# Patient Record
Sex: Female | Born: 1995 | Race: White | Hispanic: No | Marital: Married | Smoking: Never smoker
Health system: Southern US, Community
[De-identification: ages and names within clinical notes are randomized; demographics above are authoritative.]

## PROBLEM LIST (undated history)

## (undated) DIAGNOSIS — J45909 Unspecified asthma, uncomplicated: Secondary | ICD-10-CM

## (undated) DIAGNOSIS — T07XXXA Unspecified multiple injuries, initial encounter: Secondary | ICD-10-CM

## (undated) DIAGNOSIS — N946 Dysmenorrhea, unspecified: Secondary | ICD-10-CM

## (undated) DIAGNOSIS — G43909 Migraine, unspecified, not intractable, without status migrainosus: Secondary | ICD-10-CM

## (undated) DIAGNOSIS — F419 Anxiety disorder, unspecified: Secondary | ICD-10-CM

## (undated) DIAGNOSIS — J9819 Other pulmonary collapse: Secondary | ICD-10-CM

## (undated) HISTORY — DX: Unspecified asthma, uncomplicated: J45.909

## (undated) HISTORY — DX: Dysmenorrhea, unspecified: N94.6

## (undated) HISTORY — PX: WISDOM TOOTH EXTRACTION: SHX21

## (undated) HISTORY — DX: Unspecified multiple injuries, initial encounter: T07.XXXA

## (undated) HISTORY — DX: Other pulmonary collapse: J98.19

## (undated) HISTORY — PX: OTHER SURGICAL HISTORY: SHX169

## (undated) HISTORY — DX: Migraine, unspecified, not intractable, without status migrainosus: G43.909

---
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
## 2018-07-22 ENCOUNTER — Ambulatory Visit: Payer: 59 | Admitting: Obstetrics and Gynecology

## 2018-07-22 ENCOUNTER — Other Ambulatory Visit (HOSPITAL_COMMUNITY)
Admission: RE | Admit: 2018-07-22 | Discharge: 2018-07-22 | Disposition: A | Discharge status: Home / Self Care | Payer: 59 | Source: Ambulatory Visit | Attending: Obstetrics and Gynecology | Admitting: Obstetrics and Gynecology

## 2018-07-22 ENCOUNTER — Encounter: Payer: Self-pay | Admitting: Obstetrics and Gynecology

## 2018-07-22 ENCOUNTER — Other Ambulatory Visit: Payer: Self-pay

## 2018-07-22 VITALS — BP 124/76 | HR 90 | Resp 16 | Ht 65.0 in | Wt 194.8 lb

## 2018-07-22 DIAGNOSIS — Z113 Encounter for screening for infections with a predominantly sexual mode of transmission: Secondary | ICD-10-CM

## 2018-07-22 DIAGNOSIS — Z01419 Encounter for gynecological examination (general) (routine) without abnormal findings: Secondary | ICD-10-CM | POA: Insufficient documentation

## 2018-07-22 MED ORDER — DROSPIRENONE-ETHINYL ESTRADIOL 3-0.03 MG PO TABS: 1.0000 | ORAL_TABLET | Freq: Every day | ORAL | 0 refills | Status: DC

## 2018-07-22 NOTE — Patient Instructions (Signed)
EXERCISE AND DIET:  We recommended that you start or continue a regular exercise program for good health. Regular exercise means any activity that makes your heart beat faster and makes you sweat.  We recommend exercising at least 30 minutes per day at least 3 days a week, preferably 4 or 5.  We also recommend a diet low in fat and sugar.  Inactivity, poor dietary choices and obesity can cause diabetes, heart attack, stroke, and kidney damage, among others.    ALCOHOL AND SMOKING:  Women should limit their alcohol intake to no more than 7 drinks/beers/glasses of wine (combined, not each!) per week. Moderation of alcohol intake to this level decreases your risk of breast cancer and liver damage. And of course, no recreational drugs are part of a healthy lifestyle.  And absolutely no smoking or even second hand smoke. Most people know smoking can cause heart and lung diseases, but did you know it also contributes to weakening of your bones? Aging of your skin?  Yellowing of your teeth and nails?  CALCIUM AND VITAMIN D:  Adequate intake of calcium and Vitamin D are recommended.  The recommendations for exact amounts of these supplements seem to change often, but generally speaking 600 mg of calcium (either carbonate or citrate) and 800 units of Vitamin D per day seems prudent. Certain women may benefit from higher intake of Vitamin D.  If you are among these women, your doctor will have told you during your visit.    PAP SMEARS:  Pap smears, to check for cervical cancer or precancers,  have traditionally been done yearly, although recent scientific advances have shown that most women can have pap smears less often.  However, every woman still should have a physical exam from her gynecologist every year. It will include a breast check, inspection of the vulva and vagina to check for abnormal growths or skin changes, a visual exam of the cervix, and then an exam to evaluate the size and shape of the uterus and  ovaries.  And after 25 years of age, a rectal exam is indicated to check for rectal cancers. We will also provide age appropriate advice regarding health maintenance, like when you should have certain vaccines, screening for sexually transmitted diseases, bone density testing, colonoscopy, mammograms, etc.   MAMMOGRAMS:  All women over 52 years old should have a yearly mammogram. Many facilities now offer a "3D" mammogram, which may cost around $50 extra out of pocket. If possible,  we recommend you accept the option to have the 3D mammogram performed.  It both reduces the number of women who will be called back for extra views which then turn out to be normal, and it is better than the routine mammogram at detecting truly abnormal areas.    COLONOSCOPY:  Colonoscopy to screen for colon cancer is recommended for all women at age 18.  We know, you hate the idea of the prep.  We agree, BUT, having colon cancer and not knowing it is worse!!  Colon cancer so often starts as a polyp that can be seen and removed at colonscopy, which can quite literally save your life!  And if your first colonoscopy is normal and you have no family history of colon cancer, most women don't have to have it again for 10 years.  Once every ten years, you can do something that may end up saving your life, right?  We will be happy to help you get it scheduled when you are ready.  Be sure to check your insurance coverage so you understand how much it will cost.  It may be covered as a preventative service at no cost, but you should check your particular policy.     Human Papillomavirus Quadrivalent Vaccine suspension for injection What is this medicine? HUMAN PAPILLOMAVIRUS VACCINE (HYOO muhn pap uh LOH muh vahy ruhs vak SEEN) is a vaccine. It is used to prevent infections of four types of the human papillomavirus. In women, the vaccine may lower your risk of getting cervical, vaginal, vulvar, or anal cancer and genital warts. In men,  the vaccine may lower your risk of getting genital warts and anal cancer. You cannot get these diseases from the vaccine. This vaccine does not treat these diseases. This medicine may be used for other purposes; ask your health care provider or pharmacist if you have questions. COMMON BRAND NAME(S): Gardasil What should I tell my health care provider before I take this medicine? They need to know if you have any of these conditions: -fever or infection -hemophilia -HIV infection or AIDS -immune system problems -low platelet count -an unusual reaction to Human Papillomavirus Vaccine, yeast, other medicines, foods, dyes, or preservatives -pregnant or trying to get pregnant -breast-feeding How should I use this medicine? This vaccine is for injection in a muscle on your upper arm or thigh. It is given by a health care professional. You will be observed for 15 minutes after each dose. Sometimes, fainting happens after the vaccine is given. You may be asked to sit or lie down during the 15 minutes. Three doses are given. The second dose is given 2 months after the first dose. The last dose is given 4 months after the second dose. A copy of a Vaccine Information Statement will be given before each vaccination. Read this sheet carefully each time. The sheet may change frequently. Talk to your pediatrician regarding the use of this medicine in children. While this drug may be prescribed for children as young as 9 years of age for selected conditions, precautions do apply. Overdosage: If you think you have taken too much of this medicine contact a poison control center or emergency room at once. NOTE: This medicine is only for you. Do not share this medicine with others. What if I miss a dose? All 3 doses of the vaccine should be given within 6 months. Remember to keep appointments for follow-up doses. Your health care provider will tell you when to return for the next vaccine. Ask your health care  professional for advice if you are unable to keep an appointment or miss a scheduled dose. What may interact with this medicine? -other vaccines This list may not describe all possible interactions. Give your health care provider a list of all the medicines, herbs, non-prescription drugs, or dietary supplements you use. Also tell them if you smoke, drink alcohol, or use illegal drugs. Some items may interact with your medicine. What should I watch for while using this medicine? This vaccine may not fully protect everyone. Continue to have regular pelvic exams and cervical or anal cancer screenings as directed by your doctor. The Human Papillomavirus is a sexually transmitted disease. It can be passed by any kind of sexual activity that involves genital contact. The vaccine works best when given before you have any contact with the virus. Many people who have the virus do not have any signs or symptoms. Tell your doctor or health care professional if you have any reaction or unusual symptom after   getting the vaccine. What side effects may I notice from receiving this medicine? Side effects that you should report to your doctor or health care professional as soon as possible: -allergic reactions like skin rash, itching or hives, swelling of the face, lips, or tongue -breathing problems -feeling faint or lightheaded, falls Side effects that usually do not require medical attention (report to your doctor or health care professional if they continue or are bothersome): -cough -dizziness -fever -headache -nausea -redness, warmth, swelling, pain, or itching at site where injected This list may not describe all possible side effects. Call your doctor for medical advice about side effects. You may report side effects to FDA at 1-800-FDA-1088. Where should I keep my medicine? This drug is given in a hospital or clinic and will not be stored at home. NOTE: This sheet is a summary. It may not cover all  possible information. If you have questions about this medicine, talk to your doctor, pharmacist, or health care provider.  2019 Elsevier/Gold Standard (2013-08-23 13:14:33)  
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
## 2018-07-23 LAB — HEPATITIS C ANTIBODY

## 2018-07-23 LAB — HEP, RPR, HIV PANEL
HIV SCREEN 4TH GENERATION: NONREACTIVE
Hepatitis B Surface Ag: NEGATIVE
RPR Ser Ql: NONREACTIVE

## 2018-07-24 ENCOUNTER — Telehealth: Payer: Self-pay | Admitting: Obstetrics and Gynecology

## 2018-07-24 LAB — CYTOLOGY - PAP
CHLAMYDIA, DNA PROBE: NEGATIVE
DIAGNOSIS: NEGATIVE
NEISSERIA GONORRHEA: NEGATIVE
Trichomonas: NEGATIVE

## 2018-07-24 NOTE — Telephone Encounter (Signed)
Prior authorization pending for Nichole Hicks.  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  Call to patient.  Started cycle yesterday. Advised can start pills on Sunday or Today.  Need to use Back up Method for one month.  Patient has concerns about her information being released to her Mother or family.  Family is very religious.  Message to front desk supervisor to make chart confidential.

## 2018-07-24 NOTE — Telephone Encounter (Signed)
1. Patient called to check on the prior authorization for her new Syeda. Pharmacy on file confirmed.  2. Patient has some questions about when to start her Tamala Julian since she has not been able to pick it up yet.

## 2018-11-12 ENCOUNTER — Ambulatory Visit: Payer: 59 | Admitting: Obstetrics and Gynecology

## 2018-11-12 ENCOUNTER — Telehealth: Payer: Self-pay | Admitting: Obstetrics and Gynecology

## 2018-11-12 ENCOUNTER — Other Ambulatory Visit: Payer: Self-pay

## 2018-11-12 ENCOUNTER — Encounter: Payer: Self-pay | Admitting: Obstetrics and Gynecology

## 2018-11-12 VITALS — BP 118/78 | HR 76 | Resp 12 | Ht 64.0 in | Wt 201.0 lb

## 2018-11-12 DIAGNOSIS — R2231 Localized swelling, mass and lump, right upper limb: Secondary | ICD-10-CM

## 2018-11-12 DIAGNOSIS — Z3009 Encounter for other general counseling and advice on contraception: Secondary | ICD-10-CM | POA: Diagnosis not present

## 2018-11-12 NOTE — Telephone Encounter (Signed)
Spoke with patient. Patient states that she found a "knot" in her right axilla last night. Area is tender to the touch. Per patient the area appears to be slightly dimpled. Denies any redness, swelling or warmth to the area. States that the knot is her normal skin color. Advised will need to be seen in office for further evaluation. Appointment scheduled for today 11/12/2018 at 3:30 pm with Dr.Silva. Patient is agreeable to date and time.  Routing to provider and will close encounter.

## 2018-11-12 NOTE — Telephone Encounter (Signed)
Patient has a hard lump in her right armpit. Thinks it is a possible cyst.

## 2018-11-12 NOTE — Progress Notes (Signed)
GYNECOLOGY  VISIT   HPI: 23 y.o.   Single  Caucasian  female   [id number] with Patient's last menstrual period was 06/22/2018.   here for lump in right axillary.  She was working at the [employer] yesterday and she was scrubbing hard to clean after this.  It is painful to touch.  She has not seen lymph node enlargement anywhere else.   She shaves under her arms.   No scratches from cats.    Does not use IV drugs.  No change in sexual partner.  Does not eat game.  Traveled from [place], through [place], to [place] in March.   Using Claritin occasionally.  States she has asthma.   States she has nausea and attributes this to her OCPs. Her nausea is now only occasional. She saw her provider in [place] who tried to switch her OCP, but insurance did not cover this.   SA with same partner.   From [place].    Working at [employer] but work is limited.  GM with lung cancer.   GYNECOLOGIC HISTORY: Patient's last menstrual period was 06/22/2018. Contraception:  Yasmin Menopausal hormone therapy:  n/a Last mammogram:  n/a Last pap smear:   07/22/18 Negative        OB History    Gravida  0   Para  0   Term  0   Preterm  0   AB  0   Living  0     SAB  0   TAB  0   Ectopic  0   Multiple  0   Live Births  0              There are no active problems to display for this patient.   Past Medical History:  Diagnosis Date  . Asthma   . Collapsed lung    due to the flu--age 71  . Dysmenorrhea   . Multiple fractures     No past surgical history on file.  Current Outpatient Medications  Medication Sig Dispense Refill  . albuterol (PROVENTIL HFA;VENTOLIN HFA) 108 (90 BASE) MCG/ACT inhaler Inhale 2 puffs into the lungs every 6 (six) hours as needed for wheezing. For shortness of breath    . drospirenone-ethinyl estradiol (YASMIN,ZARAH,SYEDA) 3-0.03 MG tablet Take 1 tablet by mouth daily. 3 Package 0  . Fluticasone-Salmeterol (ADVAIR DISKUS) 100-50 MCG/DOSE AEPB Inhale 1 puff  into the lungs 2 (two) times daily.     No current facility-administered medications for this visit.      ALLERGIES: Aspirin and Motrin [ibuprofen]  Family History  Problem Relation Age of Onset  . Breast cancer Maternal Grandmother   . Diabetes Maternal Grandfather     Social History   Socioeconomic History  . Marital status: Single    Spouse name: Not on file  . Number of children: Not on file  . Years of education: Not on file  . Highest education level: Not on file  Occupational History  . Not on file  Social Needs  . Financial resource strain: Not on file  . Food insecurity:    Worry: Not on file    Inability: Not on file  . Transportation needs:    Medical: Not on file    Non-medical: Not on file  Tobacco Use  . Smoking status: Never Smoker  . Smokeless tobacco: Never Used  Substance and Sexual Activity  . Alcohol use: Yes    Alcohol/week: 2.0 standard drinks    Types: 2  Glasses of wine per week  . Drug use: No  . Sexual activity: Yes    Birth control/protection: Condom  Lifestyle  . Physical activity:    Days per week: Not on file    Minutes per session: Not on file  . Stress: Not on file  Relationships  . Social connections:    Talks on phone: Not on file    Gets together: Not on file    Attends religious service: Not on file    Active member of club or organization: Not on file    Attends meetings of clubs or organizations: Not on file    Relationship status: Not on file  . Intimate partner violence:    Fear of current or ex partner: Not on file    Emotionally abused: Not on file    Physically abused: Not on file    Forced sexual activity: Not on file  Other Topics Concern  . Not on file  Social History Narrative  . Not on file    Review of Systems  Constitutional:       Lump in right axillary  HENT: Negative.   Eyes: Negative.   Respiratory: Negative.   Cardiovascular: Negative.   Gastrointestinal: Negative.   Endocrine: Negative.    Genitourinary: Negative.   Musculoskeletal: Negative.   Skin: Negative.   Allergic/Immunologic: Negative.   Neurological: Negative.   Hematological: Negative.   Psychiatric/Behavioral: Negative.     PHYSICAL EXAMINATION:    BP 118/78 (BP Location: Left Arm, Patient Position: Sitting, Cuff Size: Large)   Pulse 76   Resp 12   Ht 5\' 4"  (1.626 m)   Wt 201 lb (91.2 kg)   LMP 06/22/2018   BMI 34.50 kg/m     General appearance: alert, cooperative and appears stated age Head: Normocephalic, without obvious abnormality, atraumatic Neck: no adenopathy, supple, symmetrical, trachea midline and thyroid normal to inspection and palpation Lymph nodes: Cervical, supraclavicular, and axillary nodes normal other than right axillary mass 7 - 8 mm, nontender.  Some puckering of the skin over this.   Chaperone was present for exam.  ASSESSMENT   Right axillary mass.  Sebaceous cyst versus right axillary lymph adenopathy.  Nausea with COCs.  Improved.   PLAN  I discussed epidermoid cysts.  Will get soft tissue US of axilla at [facility] 11/24/18.  We discussed switching to Yaz to lower her estrogen dosage, but she will continue with Yasmin for now.    An After Visit Summary was printed and given to the patient.  ___25___ minutes face to face time of which over 50% was spent in counseling.

## 2018-11-12 NOTE — Patient Instructions (Signed)
Epidermal Cyst    An epidermal cyst is a sac made of skin tissue. The sac contains a substance called keratin. Keratin is a protein that is normally secreted through the hair follicles. When keratin becomes trapped in the top layer of skin (epidermis), it can form an epidermal cyst.  Epidermal cysts can be found anywhere on your body. These cysts are usually harmless (benign), and they may not cause symptoms unless they become infected.  What are the causes?  This condition may be caused by:   A blocked hair follicle.   A hair that curls and re-enters the skin instead of growing straight out of the skin (ingrown hair).   A blocked pore.   Irritated skin.   An injury to the skin.   Certain conditions that are passed along from parent to child (inherited).   Human papillomavirus (HPV).   Long-term (chronic) sun damage to the skin.  What increases the risk?  The following factors may make you more likely to develop an epidermal cyst:   Having acne.   Being overweight.   Being 30-40 years old.  What are the signs or symptoms?  The only symptom of this condition may be a small, painless lump underneath the skin. When an epidermal cyst ruptures, it may become infected. Symptoms may include:   Redness.   Inflammation.   Tenderness.   Warmth.   Fever.   Keratin draining from the cyst. Keratin is grayish-white, bad-smelling substance.   Pus draining from the cyst.  How is this diagnosed?  This condition is diagnosed with a physical exam.   In some cases, you may have a sample of tissue (biopsy) taken from your cyst to be examined under a microscope or tested for bacteria.   You may be referred to a health care provider who specializes in skin care (dermatologist).  How is this treated?  In many cases, epidermal cysts go away on their own without treatment. If a cyst becomes infected, treatment may include:   Opening and draining the cyst, done by a health care provider. After draining, minor surgery to  remove the rest of the cyst may be done.   Antibiotic medicine.   Injections of medicines (steroids) that help to reduce inflammation.   Surgery to remove the cyst. Surgery may be done if the cyst:  ? Becomes large.  ? Bothers you.  ? Has a chance of turning into cancer.   Do not try to open a cyst yourself.  Follow these instructions at home:   Take over-the-counter and prescription medicines only as told by your health care provider.   If you were prescribed an antibiotic medicine, take it it as told by your health care provider. Do not stop using the antibiotic even if you start to feel better.   Keep the area around your cyst clean and dry.   Wear loose, dry clothing.   Avoid touching your cyst.   Check your cyst every day for signs of infection. Check for:  ? Redness, swelling, or pain.  ? Fluid or blood.  ? Warmth.  ? Pus or a bad smell.   Keep all follow-up visits as told by your health care provider. This is important.  How is this prevented?   Wear clean, dry, clothing.   Avoid wearing tight clothing.   Keep your skin clean and dry. Take showers or baths every day.  Contact a health care provider if:   Your cyst develops symptoms of infection.     Your condition is not improving or is getting worse.   You develop a cyst that looks different from other cysts you have had.   You have a fever.  Get help right away if:   Redness spreads from the cyst into the surrounding area.  Summary   An epidermal cyst is a sac made of skin tissue. These cysts are usually harmless (benign), and they may not cause symptoms unless they become infected.   If a cyst becomes infected, treatment may include surgery to open and drain the cyst, or to remove it. Treatment may also include medicines by mouth or through an injection.   Take over-the-counter and prescription medicines only as told by your health care provider. If you were prescribed an antibiotic medicine, take it as told by your health care  provider. Do not stop using the antibiotic even if you start to feel better.   Contact a health care provider if your condition is not improving or is getting worse.   Keep all follow-up visits as told by your health care provider. This is important.  This information is not intended to replace advice given to you by your health care provider. Make sure you discuss any questions you have with your health care provider.  Document Released: 06/01/2004 Document Revised: 01/12/2018 Document Reviewed: 01/12/2018  Elsevier Interactive Patient Education  2019 Elsevier Inc.

## 2018-11-24 ENCOUNTER — Other Ambulatory Visit: Payer: Self-pay

## 2018-11-24 ENCOUNTER — Other Ambulatory Visit: Payer: Self-pay | Admitting: Obstetrics and Gynecology

## 2018-11-24 ENCOUNTER — Ambulatory Visit
Admission: RE | Admit: 2018-11-24 | Discharge: 2018-11-24 | Disposition: A | Discharge status: Home / Self Care | Payer: 59 | Source: Ambulatory Visit | Attending: Obstetrics and Gynecology | Admitting: Obstetrics and Gynecology

## 2018-11-24 DIAGNOSIS — R2231 Localized swelling, mass and lump, right upper limb: Secondary | ICD-10-CM

## 2018-12-22 ENCOUNTER — Telehealth: Payer: Self-pay | Admitting: General Practice

## 2018-12-22 NOTE — Telephone Encounter (Signed)
Mom is patient here I have put her on for tomorrow she will be at side door at 720. Increased issues with asthma and almost out of meds.

## 2018-12-22 NOTE — Telephone Encounter (Signed)
Pt has really bad asthma and would like to get in ASAP. Soonest for np is 6/26. Pt is schedule for then, please advise if pt needs sooner   Copied from [place] [phone]. Topic: Appointment Scheduling - Scheduling Inquiry for Clinic >> Dec 22, 2018 10:39 AM Rayann Heman wrote: Reason for CRM: pt called and stated that she would like to schedule a NP with Briscoe Deutscher. Please advise

## 2018-12-23 ENCOUNTER — Ambulatory Visit: Payer: 59 | Admitting: Family Medicine

## 2018-12-23 ENCOUNTER — Encounter: Payer: Self-pay | Admitting: Family Medicine

## 2018-12-23 ENCOUNTER — Other Ambulatory Visit: Payer: Self-pay

## 2018-12-23 VITALS — BP 118/74 | HR 82 | Temp 98.6°F | Ht 64.0 in | Wt 197.0 lb

## 2018-12-23 DIAGNOSIS — J4541 Moderate persistent asthma with (acute) exacerbation: Secondary | ICD-10-CM | POA: Diagnosis not present

## 2018-12-23 DIAGNOSIS — N946 Dysmenorrhea, unspecified: Secondary | ICD-10-CM | POA: Diagnosis not present

## 2018-12-23 DIAGNOSIS — R0602 Shortness of breath: Secondary | ICD-10-CM | POA: Diagnosis not present

## 2018-12-23 DIAGNOSIS — O99210 Obesity complicating pregnancy, unspecified trimester: Secondary | ICD-10-CM | POA: Insufficient documentation

## 2018-12-23 DIAGNOSIS — J301 Allergic rhinitis due to pollen: Secondary | ICD-10-CM | POA: Diagnosis not present

## 2018-12-23 DIAGNOSIS — E669 Obesity, unspecified: Secondary | ICD-10-CM

## 2018-12-23 DIAGNOSIS — J45909 Unspecified asthma, uncomplicated: Secondary | ICD-10-CM | POA: Insufficient documentation

## 2018-12-23 LAB — POCT HEMOGLOBIN: Hemoglobin: 12.2 g/dL (ref 11–14.6)

## 2018-12-23 MED ORDER — FLUTICASONE-SALMETEROL 100-50 MCG/DOSE IN AEPB: 1.0000 | INHALATION_SPRAY | Freq: Two times a day (BID) | RESPIRATORY_TRACT | 11 refills | Status: DC

## 2018-12-23 MED ORDER — ALBUTEROL SULFATE HFA 108 (90 BASE) MCG/ACT IN AERS: 2.0000 | INHALATION_SPRAY | Freq: Four times a day (QID) | RESPIRATORY_TRACT | 11 refills | Status: AC | PRN

## 2018-12-23 MED ORDER — METHYLPREDNISOLONE ACETATE 80 MG/ML IJ SUSP
80.0000 mg | Freq: Once | INTRAMUSCULAR | Status: AC
  Administered 2018-12-23: 80 mg via INTRAMUSCULAR

## 2018-12-23 MED ORDER — PREDNISONE 10 MG PO TABS: ORAL_TABLET | ORAL | 0 refills | Status: DC

## 2018-12-23 MED ORDER — MONTELUKAST SODIUM 10 MG PO TABS: 10.0000 mg | ORAL_TABLET | Freq: Every day | ORAL | 3 refills | Status: DC

## 2018-12-23 NOTE — Progress Notes (Signed)
Nichole Hicks is a 23 y.o. female is here to [place].   Patient Care Team: Patient, No Pcp Per as PCP - General (General Practice)   History of Present Illness:   Nichole Hicks, CMA acting as scribe for Dr. Briscoe Deutscher.   HPI: Patient has history of asthma does not feel like it is under control at this time. She is currently advair daily and albuterol as rescue. She is at times needing albuterol daily now that she is working outside. She has had singular in the past that helped with asthma in the past.    Health Maintenance Due  Topic Date Due  . TETANUS/TDAP  11/12/2012   No flowsheet data found.  PMHx, SurgHx, SocialHx, Medications, and Allergies were reviewed in the Visit Navigator and updated as appropriate.   Past Medical History:  Diagnosis Date  . Collapsed lung, due to flu at age 77   . Dysmenorrhea   . Moderate persistent asthma   . Multiple fractures    No past surgical history on file.   Family History  Problem Relation Age of Onset  . Breast cancer Maternal Grandmother   . Diabetes Maternal Grandfather     Social History   Tobacco Use  . Smoking status: Never Smoker  . Smokeless tobacco: Never Used  Substance Use Topics  . Alcohol use: Yes    Alcohol/week: 2.0 standard drinks    Types: 2 Glasses of wine per week  . Drug use: No    Current Medications and Allergies   .  albuterol (VENTOLIN HFA) 108 (90 Base) MCG/ACT inhaler, Inhale 2 puffs into the lungs every 6 (six) hours as needed for wheezing. For shortness of breath, Disp: 1 Inhaler, Rfl: 11 .  drospirenone-ethinyl estradiol (YASMIN,ZARAH,SYEDA) 3-0.03 MG tablet, Take 1 tablet by mouth daily., Disp: 3 Package, Rfl: 0 .  Fluticasone-Salmeterol (ADVAIR DISKUS) 100-50 MCG/DOSE AEPB, Inhale 1 puff into the lungs 2 (two) times daily., Disp: 1 each, Rfl: 11   Allergies  Allergen Reactions  . Aspirin   . Motrin [Ibuprofen]    Review of Systems   Pertinent items are noted in the  HPI. Otherwise, a complete ROS is negative.  Vitals   Vitals:   12/23/18 0725  BP: 118/74  Pulse: 82  Temp: 98.6 F (37 C)  TempSrc: Oral  SpO2: 97%  Weight: 197 lb (89.4 kg)  Height: 5\' 4"  (1.626 m)     Body mass index is 33.81 kg/m.  Physical Exam   Physical Exam Vitals signs and nursing note reviewed.  HENT:     Head: Normocephalic and atraumatic.  Eyes:     Pupils: Pupils are equal, round, and reactive to light.  Neck:     Musculoskeletal: Normal range of motion and neck supple.  Cardiovascular:     Rate and Rhythm: Normal rate and regular rhythm.     Heart sounds: Normal heart sounds.  Pulmonary:     Effort: Pulmonary effort is normal. No respiratory distress.     Breath sounds: Decreased air movement present.  Abdominal:     Palpations: Abdomen is soft.  Skin:    General: Skin is warm.  Psychiatric:        Behavior: Behavior normal.    Results for orders placed or performed in visit on 12/23/18  POCT hemoglobin  Result Value Ref Range   Hemoglobin 12.2 11 - 14.6 g/dL   Assessment and Plan   Sofi was seen today for establish care.  Diagnoses and all orders for this visit:  SOB (shortness of breath) -     POCT hemoglobin -     methylPREDNISolone acetate (DEPO-MEDROL) injection 80 mg  Dysmenorrhea  Moderate persistent asthma with acute exacerbation -     predniSONE (DELTASONE) 10 MG tablet; 6-5-4-3-2-1-off -     Fluticasone-Salmeterol (ADVAIR DISKUS) 100-50 MCG/DOSE AEPB; Inhale 1 puff into the lungs 2 (two) times daily. -     albuterol (VENTOLIN HFA) 108 (90 Base) MCG/ACT inhaler; Inhale 2 puffs into the lungs every 6 (six) hours as needed for wheezing. For shortness of breath  Seasonal allergic rhinitis due to pollen -     montelukast (SINGULAIR) 10 MG tablet; Take 1 tablet (10 mg total) by mouth at bedtime.  Obesity, Class I, BMI 30-34.9   . Orders and follow up as documented in EpicCare, reviewed diet, exercise and weight control,  cardiovascular risk and specific lipid/LDL goals reviewed, reviewed medications and side effects in detail.  . Reviewed expectations re: course of current medical issues. . Outlined signs and symptoms indicating need for more acute intervention. . Patient verbalized understanding and all questions were answered. . Patient received an After Visit Summary.  CMA served as Neurosurgeonscribe during this visit. History, Physical, and Plan performed by medical provider. The above documentation has been reviewed and is accurate and complete. Helane RimaErica Daegen Berrocal, D.O.  Records requested if needed. Time spent: 30 minutes, of which >50% was spent in obtaining information about her symptoms, reviewing her previous labs, evaluations, and treatments, counseling her about her condition (please see the discussed topics above), and developing a plan to further investigate it; she had a number of questions which I addressed.   Helane RimaErica Almeta Geisel, DO [place], [facility] 12/23/2018

## 2019-01-08 ENCOUNTER — Ambulatory Visit: Payer: 59 | Admitting: Family Medicine

## 2019-03-29 ENCOUNTER — Other Ambulatory Visit: Payer: Self-pay

## 2019-03-29 ENCOUNTER — Ambulatory Visit (INDEPENDENT_AMBULATORY_CARE_PROVIDER_SITE_OTHER): Payer: 59 | Admitting: Family Medicine

## 2019-03-29 ENCOUNTER — Encounter: Payer: Self-pay | Admitting: Family Medicine

## 2019-03-29 VITALS — BP 120/78 | HR 84 | Temp 97.9°F | Ht 64.0 in | Wt 196.0 lb

## 2019-03-29 DIAGNOSIS — Z23 Encounter for immunization: Secondary | ICD-10-CM | POA: Diagnosis not present

## 2019-03-29 DIAGNOSIS — M940 Chondrocostal junction syndrome [Tietze]: Secondary | ICD-10-CM

## 2019-03-29 DIAGNOSIS — J4541 Moderate persistent asthma with (acute) exacerbation: Secondary | ICD-10-CM | POA: Diagnosis not present

## 2019-03-29 NOTE — Patient Instructions (Addendum)
Since you cannot take anti-inflammatories, I recommend:  Turmeric 500mg  daily  Tart cherry extract 1200mg  at night Vitamin D 2000 IU daily   Costochondritis  Costochondritis is swelling and irritation (inflammation) of the tissue (cartilage) that connects your ribs to your breastbone (sternum). This causes pain in the front of your chest. The pain usually starts gradually and involves more than one rib. What are the causes? The exact cause of this condition is not always known. It results from stress on the cartilage where your ribs attach to your sternum. The cause of this stress could be:  Chest injury (trauma).  Exercise or activity, such as lifting.  Severe coughing. What increases the risk? You may be at higher risk for this condition if you:  Are female.  Are 4430?25 years old.  Recently started a new exercise or work activity.  Have low levels of vitamin D.  Have a condition that makes you cough frequently. What are the signs or symptoms? The main symptom of this condition is chest pain. The pain:  Usually starts gradually and can be sharp or dull.  Gets worse with deep breathing, coughing, or exercise.  Gets better with rest.  May be worse when you press on the sternum-rib connection (tenderness). How is this diagnosed? This condition is diagnosed based on your symptoms, medical history, and a physical exam. Your health care provider will check for tenderness when pressing on your sternum. This is the most important finding. You may also have tests to rule out other causes of chest pain. These may include:  A chest X-ray to check for lung problems.  An electrocardiogram (ECG) to see if you have a heart problem that could be causing the pain.  An imaging scan to rule out a chest or rib fracture. How is this treated? This condition usually goes away on its own over time. Your health care provider may prescribe an NSAID to reduce pain and inflammation. Your health  care provider may also suggest that you:  Rest and avoid activities that make pain worse.  Apply heat or cold to the area to reduce pain and inflammation.  Do exercises to stretch your chest muscles. If these treatments do not help, your health care provider may inject a numbing medicine at the sternum-rib connection to help relieve the pain. Follow these instructions at home:  Avoid activities that make pain worse. This includes any activities that use chest, abdominal, and side muscles.  If directed, put ice on the painful area: ? Put ice in a plastic bag. ? Place a towel between your skin and the bag. ? Leave the ice on for 20 minutes, 2-3 times a day.  If directed, apply heat to the affected area as often as told by your health care provider. Use the heat source that your health care provider recommends, such as a moist heat pack or a heating pad. ? Place a towel between your skin and the heat source. ? Leave the heat on for 20-30 minutes. ? Remove the heat if your skin turns bright red. This is especially important if you are unable to feel pain, heat, or cold. You may have a greater risk of getting burned.  Take over-the-counter and prescription medicines only as told by your health care provider.  Return to your normal activities as told by your health care provider. Ask your health care provider what activities are safe for you.  Keep all follow-up visits as told by your health care provider. This  is important. Contact a health care provider if:  You have chills or a fever.  Your pain does not go away or it gets worse.  You have a cough that does not go away (is persistent). Get help right away if:  You have shortness of breath. This information is not intended to replace advice given to you by your health care provider. Make sure you discuss any questions you have with your health care provider. Document Released: 04/10/2005 Document Revised: 07/16/2017 Document  Reviewed: 10/25/2015 Elsevier Patient Education  2020 [employer].

## 2019-03-29 NOTE — Progress Notes (Signed)
Nichole Hicks is a 23 y.o. female is here for follow up.  History of Present Illness:   HPI: Doing well. Asthma controlled with current treatment. Needs vaccinations today. Having pain when laying on left side at sternum. Happens most nights. No SOB, wheeze, N/V, edema.   Health Maintenance Due  Topic Date Due  . TETANUS/TDAP  11/12/2012  . INFLUENZA VACCINE  02/13/2019   No flowsheet data found.   PMHx, SurgHx, SocialHx, FamHx, Medications, and Allergies were reviewed in the Visit Navigator and updated as appropriate.   Patient Active Problem List   Diagnosis Date Noted  . Obesity, Class I, BMI 30-34.9 12/23/2018  . Seasonal allergic rhinitis due to pollen 12/23/2018  . Dysmenorrhea   . Moderate persistent asthma    Social History   Tobacco Use  . Smoking status: Never Smoker  . Smokeless tobacco: Never Used  Substance Use Topics  . Alcohol use: Yes    Alcohol/week: 2.0 standard drinks    Types: 2 Glasses of wine per week  . Drug use: No   Current Medications and Allergies   .  albuterol (VENTOLIN HFA) 108 (90 Base) MCG/ACT inhaler, Inhale 2 puffs into the lungs every 6 (six) hours as needed for wheezing. For shortness of breath, Disp: 1 Inhaler, Rfl: 11 .  drospirenone-ethinyl estradiol (YASMIN,ZARAH,SYEDA) 3-0.03 MG tablet, Take 1 tablet by mouth daily., Disp: 3 Package, Rfl: 0 .  Fluticasone-Salmeterol (ADVAIR DISKUS) 100-50 MCG/DOSE AEPB, Inhale 1 puff into the lungs 2 (two) times daily., Disp: 1 each, Rfl: 11 .  montelukast (SINGULAIR) 10 MG tablet, Take 1 tablet (10 mg total) by mouth at bedtime., Disp: 30 tablet, Rfl: 3   Allergies  Allergen Reactions  . Aspirin   . Motrin [Ibuprofen]    Review of Systems   Pertinent items are noted in the HPI. Otherwise, a complete ROS is negative.  Vitals   Vitals:   03/29/19 0834  BP: 120/78  Pulse: 84  Temp: 97.9 F (36.6 C)  TempSrc: Temporal  SpO2: 97%  Weight: 196 lb (88.9 kg)  Height: 5\' 4"  (1.626 m)     Body mass index is 33.64 kg/m.  Physical Exam   Physical Exam Vitals signs and nursing note reviewed.  HENT:     Head: Normocephalic and atraumatic.  Eyes:     Pupils: Pupils are equal, round, and reactive to light.  Neck:     Musculoskeletal: Normal range of motion and neck supple.  Cardiovascular:     Rate and Rhythm: Normal rate and regular rhythm.     Heart sounds: Normal heart sounds.  Pulmonary:     Effort: Pulmonary effort is normal.  Chest:     Comments: TTP costochondral junctions. Abdominal:     Palpations: Abdomen is soft.  Skin:    General: Skin is warm.  Psychiatric:        Behavior: Behavior normal.    Assessment and Plan   Kaliana was seen today for follow-up.  Diagnoses and all orders for this visit:  Costochondritis Comments: See AVS.  Moderate persistent asthma with acute exacerbation Comments: Stable. Continue current treatment.   Need for Tdap vaccination -     Tdap vaccine greater than or equal to 7yo IM  Flu vaccine need -     Flu Vaccine QUAD 6+ mos PF IM (Fluarix Quad PF)   . Orders and follow up as documented in EpicCare, reviewed diet, exercise and weight control, cardiovascular risk and specific lipid/LDL goals reviewed,  reviewed medications and side effects in detail.  . Reviewed expectations re: course of current medical issues. . Outlined signs and symptoms indicating need for more acute intervention. . Patient verbalized understanding and all questions were answered. . Patient received an After Visit Summary.  Briscoe Deutscher, DO [place], [facility] 03/29/2019

## 2019-05-18 ENCOUNTER — Telehealth: Payer: Self-pay | Admitting: Obstetrics and Gynecology

## 2019-05-18 NOTE — Telephone Encounter (Signed)
Patient is asking for new prescription for birth control. She cannot find her "pills" ans the pharmacy told her it would cost $100.00 to replace them. She states she normally just pays her copay and is wondering if we have any advice for on getting a new prescription.  [place].

## 2019-05-19 NOTE — Telephone Encounter (Signed)
Called pt. No answer and voicemail box is full.

## 2019-05-19 NOTE — Telephone Encounter (Signed)
Patient returned call

## 2019-05-19 NOTE — Telephone Encounter (Signed)
Please let me know if she wants to discuss other options for birth control if she is having break through bleeding or difficulty remembering her pills.   I agree with doubling up if she misses a pill. She should be protected against pregnancy in this circumstance.   If she missed two or more pills in a pack, she is not protected against pregnancy.
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
## 2019-05-20 NOTE — Telephone Encounter (Signed)
Call to patient, no answer, mailbox full. Unable to leave message.

## 2019-05-24 NOTE — Telephone Encounter (Signed)
Spoke with patient, advised per Dr. Quincy Simmonds. Patient declines OV or WebEx at this time to discuss alternative contraceptives. If BTB continues, she will return call or discuss at next AEX. Patient verbalizes understanding.   Routing to provider for final review. Patient is agreeable to disposition. Will close encounter.

## 2019-06-21 ENCOUNTER — Telehealth (INDEPENDENT_AMBULATORY_CARE_PROVIDER_SITE_OTHER): Payer: Self-pay | Admitting: Family Medicine

## 2019-06-21 NOTE — Telephone Encounter (Signed)
Medication Refill - Medication:   drospirenone-ethinyl estradiol (YASMIN,ZARAH,SYEDA) 3-0.03 MG tablet Pt stated she only has a week left  Has the patient contacted their pharmacy? No. (Agent: If no, request that the patient contact the pharmacy for the refill.) (Agent: If yes, when and what did the pharmacy advise?)  Preferred Pharmacy (with phone number or street name):  [facility] [place] [facility] [place] [phone] (Phone)     Agent: Please be advised that RX refills may take up to 3 business days. We ask that you follow-up with your pharmacy.

## 2019-06-22 NOTE — Telephone Encounter (Signed)
Patient has a gynecologist who prescribes her birth control pills. She needs to contact them for this refill.

## 2019-06-22 NOTE — Telephone Encounter (Signed)
See request °

## 2019-06-25 ENCOUNTER — Other Ambulatory Visit: Payer: Self-pay

## 2019-06-25 MED ORDER — DROSPIRENONE-ETHINYL ESTRADIOL 3-0.03 MG PO TABS: 1.0000 | ORAL_TABLET | Freq: Every day | ORAL | 0 refills | Status: DC

## 2019-06-25 NOTE — Telephone Encounter (Signed)
Patient called in stating she is checking on the status of her refill. 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 Pt became angry and states it is frustrating as she only has 4 pills left and no time to get an appointment, and that she didn't receive a call like they said she would. Pt is wanting a refill still and did advise pt there is a possibility it still cannot be done but will try. Pt was still irritated and is wanting a call to see what can be done. Please advise.

## 2019-06-25 NOTE — Telephone Encounter (Signed)
See note

## 2019-06-25 NOTE — Telephone Encounter (Signed)
LM for patient to return call.  She has been provided a 1 month supply.  Must have appointment with provider before she will receive any further refills.  CRM placed.

## 2019-07-22 ENCOUNTER — Other Ambulatory Visit: Payer: Self-pay | Admitting: Family Medicine

## 2019-08-12 ENCOUNTER — Telehealth: Payer: Self-pay | Admitting: Family Medicine

## 2019-08-12 ENCOUNTER — Telehealth: Payer: Self-pay

## 2019-08-12 NOTE — Telephone Encounter (Signed)
Please clarify. This generic birth control was ordered for her last month with refills.  Is there a reason she needs brand name and PA? I haven't seen her before so I don't know the answers; if she knows then we can try for prior auth but the generic should be available for her now.   She is scheduled to see me in June!

## 2019-08-12 NOTE — Telephone Encounter (Signed)
Lysle Morales PA completed via CoverMyMeds for Syeda 3-0.03MG  tablets Key: [id number] - Rx #: [id number]

## 2019-08-12 NOTE — Telephone Encounter (Signed)
MEDICATION: Syeda 3-0.03 MG tablet  PHARMACY: [facility] [phone] [place]  Comments: Pt needs prior [place]. Stated to mark it as URGENT so she can get the medication ASAP. Insurance # is [id number].  **Let patient know to contact pharmacy at the end of the day to make sure medication is ready. **  ** Please notify patient to allow 48-72 hours to process**  **Encourage patient to contact the pharmacy for refills or they can request refills through [facility]**

## 2019-08-13 NOTE — Telephone Encounter (Signed)
Called not able to leave message.   We have submitted auth. Received fax just not that it was denied. Have reviewed good rx and patient can pick up with coupon for [facility].49 at [facility].

## 2019-08-13 NOTE — Telephone Encounter (Signed)
Patient calling back frustrated she said that her insurance company said they have not received a prior auth for her birth control. Patient would like a call back as soon as possible. Please advise.

## 2019-08-13 NOTE — Telephone Encounter (Signed)
Nichole Hicks (KeyCheryl Flash)  Your information has been submitted to [employer]. To check for an updated outcome later, reopen this PA request from your dashboard.  If [employer] has not responded to your request within 24 hours, contact [employer] at [phone]. If you think there may be a problem with your PA request, use our live chat feature at the bottom right.

## 2019-08-13 NOTE — Telephone Encounter (Signed)
Patient was notified that her insurance is not going to cover Syeda. Patient notified that there is a coupon that can be downloaded for her to pay 25.49. Patient states that is too much for her to pay and wants to know why her insurance is not paying for it after years of paying for it. Is there another birth control that can be sent in? Please advise.

## 2019-08-13 NOTE — Telephone Encounter (Signed)
PA completed via CoverMyMeds [id number]

## 2019-08-16 NOTE — Telephone Encounter (Signed)
Unable to reach patient, VM box full.

## 2019-08-16 NOTE — Telephone Encounter (Signed)
Will need to use generic formulation. I cannot explain why insurance company has changed their coverage or formulary. She would have to take the up with the insurance company.  Please notify patient.

## 2019-08-18 NOTE — Telephone Encounter (Signed)
Patient notified of message

## 2019-09-18 ENCOUNTER — Ambulatory Visit: Payer: 59 | Attending: Internal Medicine

## 2019-09-18 DIAGNOSIS — Z23 Encounter for immunization: Secondary | ICD-10-CM | POA: Insufficient documentation

## 2019-09-18 NOTE — Progress Notes (Signed)
   Covid-19 Vaccination Clinic  Name:  Nichole Hicks    MRN: [id number] DOB: 21-Oct-1995  09/18/2019  [name] was observed post Covid-19 immunization for 15 minutes without incident. She was provided with Vaccine Information Sheet and instruction to access the V-Safe system.   [name] was instructed to call 911 with any severe reactions post vaccine: Marland Kitchen Difficulty breathing  . Swelling of face and throat  . A fast heartbeat  . A bad rash all over body  . Dizziness and weakness   Immunizations Administered    Name Date Dose VIS Date Route   Moderna COVID-19 Vaccine 09/18/2019  1:28 PM 0.5 mL 06/15/2019 Intramuscular   Manufacturer: Moderna   Lot: [id number]   NDC: 81103-159-45

## 2019-10-16 ENCOUNTER — Other Ambulatory Visit: Payer: Self-pay

## 2019-10-16 ENCOUNTER — Ambulatory Visit: Payer: 59 | Attending: Internal Medicine

## 2019-10-16 DIAGNOSIS — Z23 Encounter for immunization: Secondary | ICD-10-CM

## 2019-10-16 NOTE — Progress Notes (Signed)
   Covid-19 Vaccination Clinic  Name:  TONILYNN BIEKER    MRN: [id number] DOB: [birth year]  10/16/2019  Ms. Katrinka Blazing was observed post Covid-19 immunization for 15 minutes without incident. She was provided with Vaccine Information Sheet and instruction to access the V-Safe system.   Ms. Katrinka Blazing was instructed to call 911 with any severe reactions post vaccine: Marland Kitchen Difficulty breathing  . Swelling of face and throat  . A fast heartbeat  . A bad rash all over body  . Dizziness and weakness   Immunizations Administered    Name Date Dose VIS Date Route   Moderna COVID-19 Vaccine 10/16/2019 12:18 PM 0.5 mL 06/15/2019 Intramuscular   Manufacturer: Gala Murdoch   Lot: [id number]   NDC: 16244-695-07

## 2019-12-24 ENCOUNTER — Encounter: Payer: 59 | Admitting: Family Medicine

## 2020-04-06 ENCOUNTER — Telehealth: Payer: Self-pay | Admitting: Obstetrics and Gynecology

## 2020-04-06 ENCOUNTER — Other Ambulatory Visit: Payer: Self-pay | Admitting: Family Medicine

## 2020-04-06 ENCOUNTER — Other Ambulatory Visit: Payer: Self-pay | Admitting: Obstetrics and Gynecology

## 2020-04-06 NOTE — Telephone Encounter (Signed)
Has toc appt with me in November. meds refilled till then.

## 2020-04-06 NOTE — Telephone Encounter (Signed)
Patient called regarding a prescription that was denied. The refill request states it was denied due to responded by other means, fax or phone. The pharmacy told her our office denied the prescription.

## 2020-04-06 NOTE — Telephone Encounter (Signed)
Call returned to patient, no answer, mailbox full, unable to leave message.   Per review of Epic, a refill request was sent to her PCP and GYN for Syeda. Tamala Julian was refilled by her PCP on 04/06/20, so the RX was then denied by Dr. Edward Jolly.

## 2020-04-07 ENCOUNTER — Other Ambulatory Visit: Payer: Self-pay

## 2020-04-07 MED ORDER — DROSPIRENONE-ETHINYL ESTRADIOL 3-0.03 MG PO TABS: 1.0000 | ORAL_TABLET | Freq: Every day | ORAL | 2 refills | Status: DC

## 2020-04-07 NOTE — Telephone Encounter (Signed)
Spoke with pt. Pt given update on Rx Syeda. Pt verbalized understanding of Dr Mardelle Matte called in Rx to pharmacy on file on 04/06/20 # 28, 2 RF. Pt agreeable.  Encounter closed.

## 2020-05-15 ENCOUNTER — Encounter: Payer: Managed Care, Other (non HMO) | Admitting: Family Medicine

## 2020-05-15 DIAGNOSIS — Z0289 Encounter for other administrative examinations: Secondary | ICD-10-CM

## 2020-12-08 ENCOUNTER — Other Ambulatory Visit: Payer: Self-pay

## 2020-12-08 ENCOUNTER — Emergency Department (HOSPITAL_BASED_OUTPATIENT_CLINIC_OR_DEPARTMENT_OTHER): Payer: PRIVATE HEALTH INSURANCE

## 2020-12-08 ENCOUNTER — Emergency Department (HOSPITAL_BASED_OUTPATIENT_CLINIC_OR_DEPARTMENT_OTHER)
Admission: EM | Admit: 2020-12-08 | Discharge: 2020-12-08 | Disposition: A | Discharge status: Home / Self Care | Payer: PRIVATE HEALTH INSURANCE | Attending: Emergency Medicine | Admitting: Emergency Medicine

## 2020-12-08 ENCOUNTER — Encounter (HOSPITAL_BASED_OUTPATIENT_CLINIC_OR_DEPARTMENT_OTHER): Payer: Self-pay | Admitting: *Deleted

## 2020-12-08 DIAGNOSIS — Z7951 Long term (current) use of inhaled steroids: Secondary | ICD-10-CM | POA: Diagnosis not present

## 2020-12-08 DIAGNOSIS — J069 Acute upper respiratory infection, unspecified: Secondary | ICD-10-CM | POA: Diagnosis not present

## 2020-12-08 DIAGNOSIS — D72829 Elevated white blood cell count, unspecified: Secondary | ICD-10-CM | POA: Diagnosis not present

## 2020-12-08 DIAGNOSIS — R519 Headache, unspecified: Secondary | ICD-10-CM

## 2020-12-08 DIAGNOSIS — R112 Nausea with vomiting, unspecified: Secondary | ICD-10-CM | POA: Insufficient documentation

## 2020-12-08 DIAGNOSIS — J4540 Moderate persistent asthma, uncomplicated: Secondary | ICD-10-CM | POA: Diagnosis not present

## 2020-12-08 DIAGNOSIS — R000 Tachycardia, unspecified: Secondary | ICD-10-CM | POA: Diagnosis not present

## 2020-12-08 LAB — COMPREHENSIVE METABOLIC PANEL
ALT: 36 U/L (ref 0–44)
AST: 47 U/L — ABNORMAL HIGH (ref 15–41)
Albumin: 3.6 g/dL (ref 3.5–5.0)
Alkaline Phosphatase: 81 U/L (ref 38–126)
Anion gap: 10 (ref 5–15)
BUN: 9 mg/dL (ref 6–20)
CO2: 21 mmol/L — ABNORMAL LOW (ref 22–32)
Calcium: 9.1 mg/dL (ref 8.9–10.3)
Chloride: 101 mmol/L (ref 98–111)
Creatinine, Ser: 0.76 mg/dL (ref 0.44–1.00)
GFR, Estimated: >60 mL/min (ref >60)
Glucose, Bld: 112 mg/dL — ABNORMAL HIGH (ref 70–99)
Potassium: 4 mmol/L (ref 3.5–5.1)
Sodium: 132 mmol/L — ABNORMAL LOW (ref 135–145)
Total Bilirubin: 0.2 mg/dL — ABNORMAL LOW (ref 0.3–1.2)
Total Protein: 7.6 g/dL (ref 6.5–8.1)

## 2020-12-08 LAB — CBC WITH DIFFERENTIAL/PLATELET
Abs Immature Granulocytes: 0.04 10*3/uL (ref 0.00–0.07)
Basophils Absolute: 0.1 10*3/uL (ref 0.0–0.1)
Basophils Relative: 1 %
Eosinophils Absolute: 0.1 10*3/uL (ref 0.0–0.5)
Eosinophils Relative: 1 %
HCT: 39.5 % (ref 36.0–46.0)
Hemoglobin: 13.7 g/dL (ref 12.0–15.0)
Immature Granulocytes: 0 %
Lymphocytes Relative: 17 %
Lymphs Abs: 2.3 10*3/uL (ref 0.7–4.0)
MCH: 29.1 pg (ref 26.0–34.0)
MCHC: 34.7 g/dL (ref 30.0–36.0)
MCV: 83.9 fL (ref 80.0–100.0)
Monocytes Absolute: 0.6 10*3/uL (ref 0.1–1.0)
Monocytes Relative: 5 %
Neutro Abs: 10.5 10*3/uL — ABNORMAL HIGH (ref 1.7–7.7)
Neutrophils Relative %: 76 %
Platelets: 433 10*3/uL — ABNORMAL HIGH (ref 150–400)
RBC: 4.71 MIL/uL (ref 3.87–5.11)
RDW: 11.9 % (ref 11.5–15.5)
WBC: 13.6 10*3/uL — ABNORMAL HIGH (ref 4.0–10.5)
nRBC: 0 % (ref 0.0–0.2)

## 2020-12-08 LAB — TROPONIN I (HIGH SENSITIVITY): Troponin I (High Sensitivity): <2 ng/L (ref <18)

## 2020-12-08 LAB — PREGNANCY, URINE: Preg Test, Ur: NEGATIVE

## 2020-12-08 LAB — D-DIMER, QUANTITATIVE: D-Dimer, Quant: 0.37 ug/mL-FEU (ref 0.00–0.50)

## 2020-12-08 MED ORDER — ONDANSETRON 4 MG PO TBDP: 4.0000 mg | ORAL_TABLET | Freq: Three times a day (TID) | ORAL | 0 refills | Status: DC | PRN

## 2020-12-08 MED ORDER — CYCLOBENZAPRINE HCL 10 MG PO TABS
10.0000 mg | ORAL_TABLET | Freq: Every day | ORAL | 0 refills | Status: DC
Start: 2020-12-08 — End: 2021-11-26

## 2020-12-08 MED ORDER — ONDANSETRON 4 MG PO TBDP
8.0000 mg | ORAL_TABLET | Freq: Once | ORAL | Status: AC
  Administered 2020-12-08: 8 mg via ORAL
  Filled 2020-12-08: qty 2

## 2020-12-08 MED ORDER — DEXAMETHASONE SODIUM PHOSPHATE 10 MG/ML IJ SOLN
8.0000 mg | Freq: Once | INTRAMUSCULAR | Status: AC
  Administered 2020-12-08: 8 mg via INTRAVENOUS
  Filled 2020-12-08: qty 1

## 2020-12-08 NOTE — ED Notes (Signed)
Pt unable to void but aware of the need for a urine sample.

## 2020-12-08 NOTE — ED Notes (Signed)
ED Provider at bedside. 

## 2020-12-08 NOTE — ED Triage Notes (Signed)
Headache x 4 days. Pressure in her ears. Dizziness.

## 2020-12-08 NOTE — Discharge Instructions (Addendum)
I prescribed you a medication called Zofran that you can take up to 3 times a day for management of your nausea and vomiting.  Please take this as prescribed.  I am prescribing you a strong muscle relaxer called flexeril. Please only take this medication once in the evening with dinner. This medication can make you quite drowsy. Do not mix it with alcohol. Do not drive a vehicle after taking it.   Please continue to monitor your symptoms closely.  If you develop worsening headaches, nausea/vomiting you cannot control, or generally worsening symptoms, please come back to the emergency department for reevaluation.  Please make sure you follow-up with your regular doctor next week regarding your symptoms if they persist.
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
## 2020-12-08 NOTE — ED Notes (Addendum)
Pt came back into triage c/o increased dizziness, new onset shortness of breath, feeling suffocated, lightheaded after sitting in the waiting room. SpO2 showed 146bpm 100%, pt states her apple watch shows the highest it reached was 151bpm. Pt hooked to EKG machine, EKG captured right as heart rate started to lower and printed at 115bpm. MD and RN made aware. documented by Jenne Campus, EMT

## 2020-12-08 NOTE — ED Notes (Signed)
Pt ambulatory with steady gait to restroom 

## 2020-12-08 NOTE — ED Notes (Signed)
Pt ambulatory with steady gait to restroom to provide urine specimen 

## 2020-12-08 NOTE — ED Notes (Signed)
Patient transported to CT 

## 2021-05-15 LAB — OB RESULTS CONSOLE HEPATITIS B SURFACE ANTIGEN: Hepatitis B Surface Ag: NEGATIVE

## 2021-05-15 LAB — OB RESULTS CONSOLE RUBELLA ANTIBODY, IGM: Rubella: IMMUNE

## 2021-05-15 LAB — OB RESULTS CONSOLE GC/CHLAMYDIA
Chlamydia: NEGATIVE
Gonorrhea: NEGATIVE

## 2021-05-15 LAB — OB RESULTS CONSOLE HIV ANTIBODY (ROUTINE TESTING): HIV: NONREACTIVE

## 2021-05-25 ENCOUNTER — Other Ambulatory Visit: Payer: Self-pay

## 2021-05-25 ENCOUNTER — Emergency Department (HOSPITAL_BASED_OUTPATIENT_CLINIC_OR_DEPARTMENT_OTHER)
Admission: EM | Admit: 2021-05-25 | Discharge: 2021-05-25 | Disposition: A | Discharge status: Home / Self Care | Payer: No Typology Code available for payment source | Attending: Emergency Medicine | Admitting: Emergency Medicine

## 2021-05-25 ENCOUNTER — Encounter (HOSPITAL_BASED_OUTPATIENT_CLINIC_OR_DEPARTMENT_OTHER): Payer: Self-pay

## 2021-05-25 DIAGNOSIS — Y92812 Truck as the place of occurrence of the external cause: Secondary | ICD-10-CM | POA: Insufficient documentation

## 2021-05-25 DIAGNOSIS — Y990 Civilian activity done for income or pay: Secondary | ICD-10-CM | POA: Insufficient documentation

## 2021-05-25 DIAGNOSIS — S3992XA Unspecified injury of lower back, initial encounter: Secondary | ICD-10-CM | POA: Diagnosis not present

## 2021-05-25 DIAGNOSIS — W1789XA Other fall from one level to another, initial encounter: Secondary | ICD-10-CM | POA: Diagnosis not present

## 2021-05-25 DIAGNOSIS — J45909 Unspecified asthma, uncomplicated: Secondary | ICD-10-CM | POA: Insufficient documentation

## 2021-05-25 DIAGNOSIS — O9A211 Injury, poisoning and certain other consequences of external causes complicating pregnancy, first trimester: Secondary | ICD-10-CM | POA: Insufficient documentation

## 2021-05-25 DIAGNOSIS — Z7952 Long term (current) use of systemic steroids: Secondary | ICD-10-CM | POA: Diagnosis not present

## 2021-05-25 NOTE — Discharge Instructions (Signed)
  You can take tylenol up to 1000mg (2 extra strength) four times a day.   Return for vaginal bleeding, pelvic pain

## 2021-05-25 NOTE — ED Provider Notes (Signed)
[facility] EMERGENCY DEPARTMENT Provider Note   CSN: [id number] Arrival date & time: 05/25/21  1926     History Chief Complaint  Patient presents with   Nichole Hicks is a 25 y.o. female.  25 yo F with a chief complaints of low back pain after falling.  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  She is about [redacted] weeks pregnant and she was concerned about the status of her pregnancy.  Denies vaginal bleeding denies pelvic pain.  Pain worse when she sits and improves when she stands.  Has not taken anything for this.  The history is provided by the patient.  Fall This is a new problem. The current episode started 6 to 12 hours ago. The problem occurs constantly. The problem has not changed since onset.Pertinent negatives include no chest pain, no headaches and no shortness of breath. Exacerbated by: sitting. Nothing relieves the symptoms. She has tried nothing for the symptoms. The treatment provided no relief.      Past Medical History:  Diagnosis Date   Collapsed lung, due to flu at age 9    Dysmenorrhea    Moderate persistent asthma    Multiple fractures     Patient Active Problem List   Diagnosis Date Noted   Obesity, Class I, BMI 30-34.9 12/23/2018   Seasonal allergic rhinitis due to pollen 12/23/2018   Dysmenorrhea    Moderate persistent asthma     History reviewed. No pertinent surgical history.   OB History     Gravida  1   Para  0   Term  0   Preterm  0   AB  0   Living  0      SAB  0   IAB  0   Ectopic  0   Multiple  0   Live Births  0           Family History  Problem Relation Age of Onset   Breast cancer Maternal Grandmother    Diabetes Maternal Grandfather     Social History   Tobacco Use   Smoking status: Never   Smokeless tobacco: Never  Vaping Use   Vaping Use: Never used   Substance Use Topics   Alcohol use: Yes    Alcohol/week: 2.0 standard drinks    Types: 2 Glasses of wine per week   Drug use: No    Home Medications Prior to Admission medications   Medication Sig Start Date End Date Taking? Authorizing Provider  albuterol (VENTOLIN HFA) 108 (90 Base) MCG/ACT inhaler Inhale 2 puffs into the lungs every 6 (six) hours as needed for wheezing. For shortness of breath 12/23/18   Briscoe Deutscher, DO  cyclobenzaprine (FLEXERIL) 10 MG tablet Take 1 tablet (10 mg total) by mouth at bedtime. 12/08/20   Rayna Sexton, PA-C  drospirenone-ethinyl estradiol (SYEDA) 3-0.03 MG tablet Take 1 tablet by mouth daily. 04/07/20   Leamon Arnt, MD  Fluticasone-Salmeterol (ADVAIR DISKUS) 100-50 MCG/DOSE AEPB Inhale 1 puff into the lungs 2 (two) times daily. 12/23/18   Briscoe Deutscher, DO  montelukast (SINGULAIR) 10 MG tablet Take 1 tablet (10 mg total) by mouth at bedtime. 12/23/18   Briscoe Deutscher, DO  ondansetron (ZOFRAN ODT) 4 MG disintegrating tablet Take 1 tablet (4 mg total) by mouth every 8 (eight) hours as needed  for nausea or vomiting. 12/08/20   Placido Sou, PA-C    Allergies    Aspirin and Ibuprofen  Review of Systems   Review of Systems  Constitutional:  Negative for chills and fever.  HENT:  Negative for congestion and rhinorrhea.   Eyes:  Negative for redness and visual disturbance.  Respiratory:  Negative for shortness of breath and wheezing.   Cardiovascular:  Negative for chest pain and palpitations.  Gastrointestinal:  Negative for nausea and vomiting.  Genitourinary:  Negative for dysuria and urgency.  Musculoskeletal:  Positive for back pain. Negative for arthralgias and myalgias.  Skin:  Negative for pallor and wound.  Neurological:  Negative for dizziness and headaches.   Physical Exam Updated Vital Signs BP 128/80 (BP Location: Left Arm)   Pulse 99   Temp 98.2 F (36.8 C) (Oral)   Resp 18   Ht 5\' 4"  (1.626 m)   Wt 97.5 kg   SpO2 100%    BMI 36.90 kg/m   Physical Exam Vitals and nursing note reviewed.  Constitutional:      General: She is not in acute distress.    Appearance: She is well-developed. She is not diaphoretic.  HENT:     Head: Normocephalic and atraumatic.  Eyes:     Pupils: Pupils are equal, round, and reactive to light.  Cardiovascular:     Rate and Rhythm: Normal rate and regular rhythm.     Heart sounds: No murmur heard.   No friction rub. No gallop.  Pulmonary:     Effort: Pulmonary effort is normal.     Breath sounds: No wheezing or rales.  Abdominal:     General: There is no distension.     Palpations: Abdomen is soft.     Tenderness: There is no abdominal tenderness.  Musculoskeletal:        General: Tenderness present.     Cervical back: Normal range of motion and neck supple.     Comments: Pain worse to the tailbone.  No obvious step-offs or deformities.  Skin:    General: Skin is warm and dry.  Neurological:     Mental Status: She is alert and oriented to person, place, and time.  Psychiatric:        Behavior: Behavior normal.    ED Results / Procedures / Treatments   Labs (all labs ordered are listed, but only abnormal results are displayed) Labs Reviewed - No data to display  EKG None  Radiology No results found.  Procedures Procedures  EMERGENCY DEPARTMENT PREGNANCY "Study: Limited Ultrasound of the Pelvis"  INDICATIONS:Pregnancy(required) and back pain Multiple views of the uterus and pelvic cavity are obtained with a multi-frequency probe.  APPROACH:Transabdominal   PERFORMED BY: Myself  IMAGES ARCHIVED?: Yes  LIMITATIONS: pain  PREGNANCY FREE FLUID: None  PREGNANCY UTERUS FINDINGS:Uterus enlarged and Gestational sac noted ADNEXAL FINDINGS:Left ovary not seen and Right ovary not seen  PREGNANCY FINDINGS: Intrauterine gestational sac noted, Fetal pole present, and Fetal heart activity seen  INTERPRETATION: Intrauterine gestational sac noted, Yolk sac  noted, Fetal pole present, and Fetal heart activity seen  GESTATIONAL AGE, ESTIMATE: 10 wk 5 days, HR 167  Medications Ordered in ED Medications - No data to display  ED Course  I have reviewed the triage vital signs and the nursing notes.  Pertinent labs & imaging results that were available during my care of the patient were reviewed by me and considered in my medical decision making (see chart for details).  MDM Rules/Calculators/A&P                           25 yo F with a chief complaints of a fall.  The patient complaining mostly of back pain but she is concerned that she is [redacted] weeks pregnant.  No concerning finding on bedside ultrasound.  Will discharge home.  Tylenol as needed for pain.  8:21 PM:  I have discussed the diagnosis/risks/treatment options with the patient and family and believe the pt to be eligible for discharge home to follow-up with OB. We also discussed returning to the ED immediately if new or worsening sx occur. We discussed the sx which are most concerning (e.g., sudden worsening pain, fever, inability to tolerate by mouth) that necessitate immediate return. Medications administered to the patient during their visit and any new prescriptions provided to the patient are listed below.  Medications given during this visit Medications - No data to display   The patient appears reasonably screen and/or stabilized for discharge and I doubt any other medical condition or other [facility] requiring further screening, evaluation, or treatment in the ED at this time prior to discharge.   Final Clinical Impression(s) / ED Diagnoses Final diagnoses:  Tailbone injury, initial encounter    Rx / DC Orders ED Discharge Orders     None        Deno Etienne, DO 05/25/21 2021

## 2021-05-25 NOTE — ED Triage Notes (Addendum)
Fall while at work today. C/o lower back pain. Ambulatory. [redacted] weeks pregnant requesting evaluation.

## 2021-06-07 IMAGING — CT CT HEAD W/O CM
4 series · 16 of 47 positions shown, 18 images · non-contrast
Comparison: None.

CLINICAL DATA: Headache for 4 days, ear pressure, dizziness

EXAM:
CT HEAD WITHOUT CONTRAST
TECHNIQUE: Contiguous axial images were obtained from the base of the skull
through the vertex without intravenous contrast.

[Series 2: head wo · axial · 0.43mm/px · z∈[+824,+924]mm · 7 of 28 slices shown, 9 images]
[im 4/28  brain]
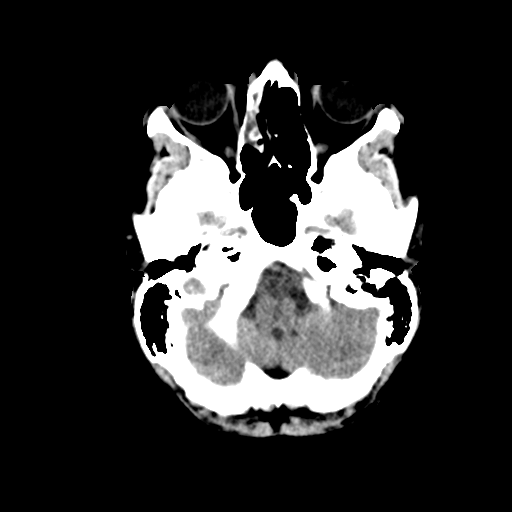
[im 4/28  bone]
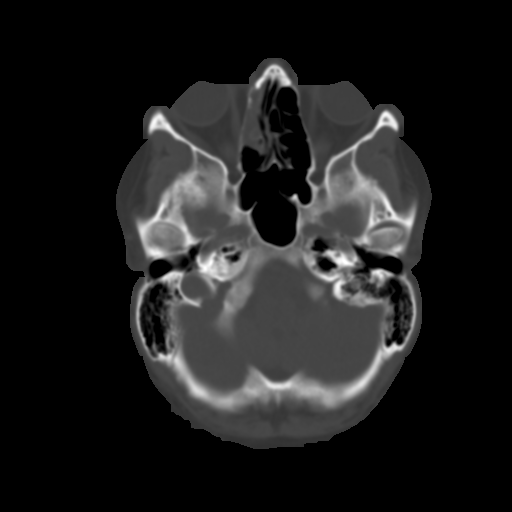
[im 7/28  brain]
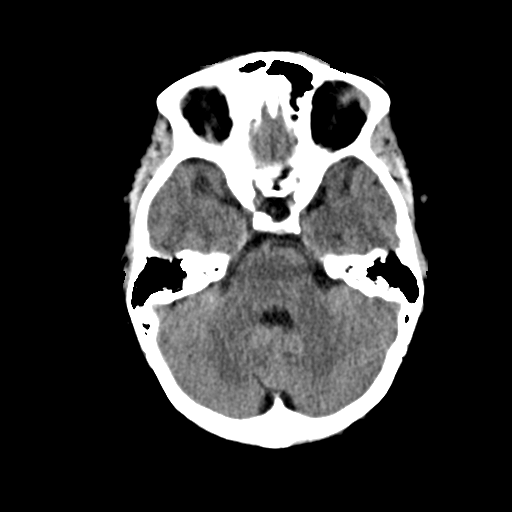
[im 11/28  brain]
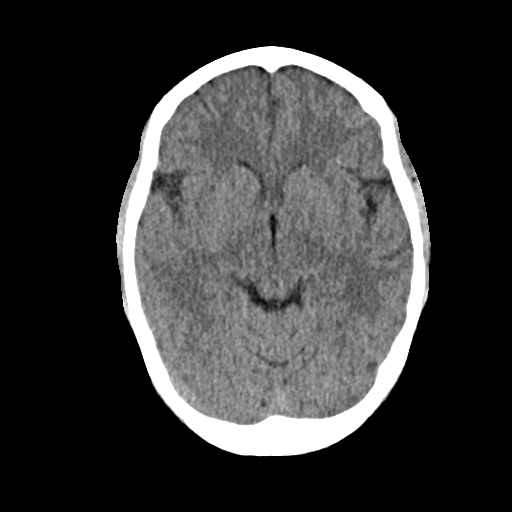
[im 14/28  brain]
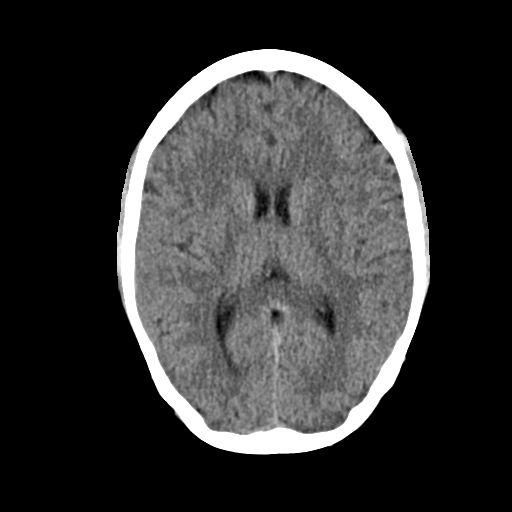
[im 17/28  brain]
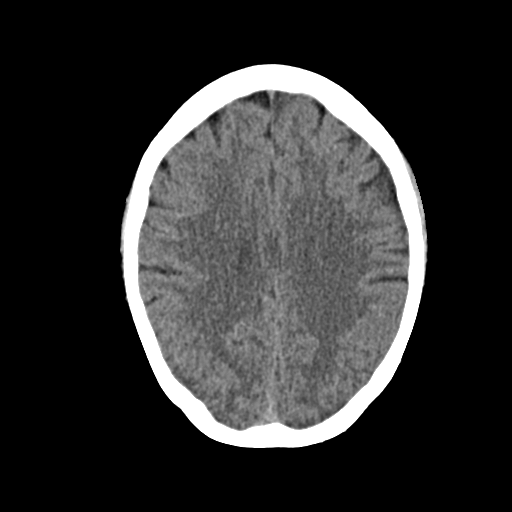
[im 17/28  bone]
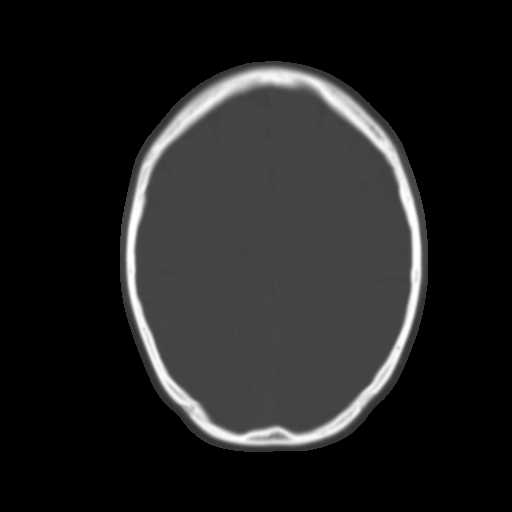
[im 21/28  brain]
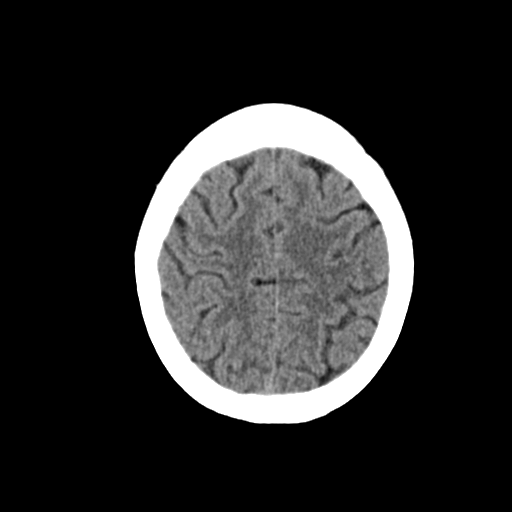
[im 24/28  brain]
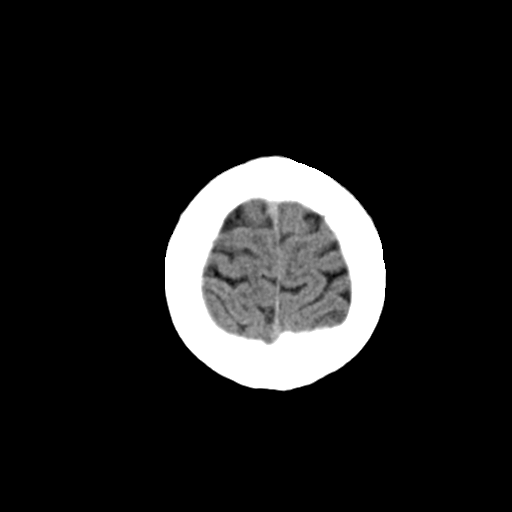

[Series 3: head bone · axial · 0.43mm/px · z∈[+821,+849]mm · 3 of 70 slices shown]
[im 7/70  bone]
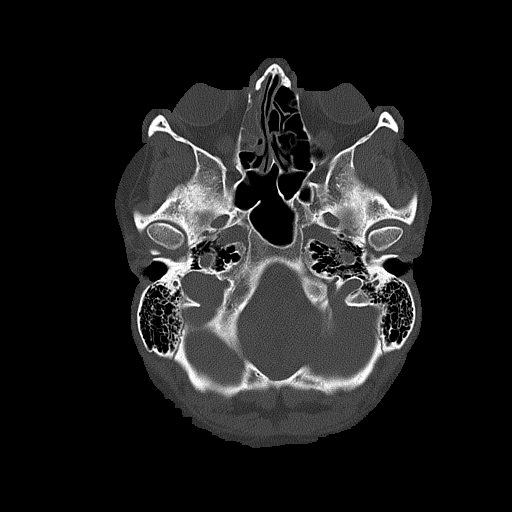
[im 14/70  bone]
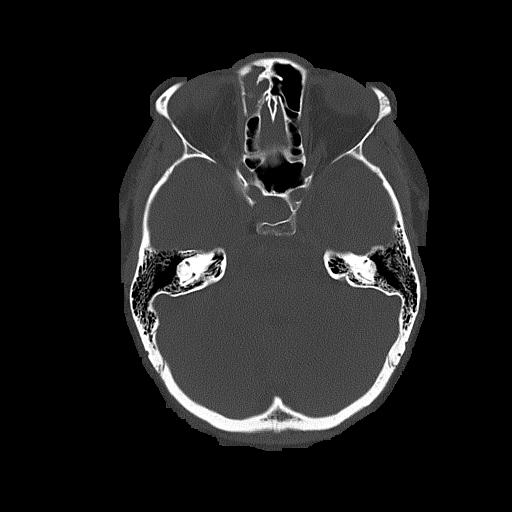
[im 21/70  bone]
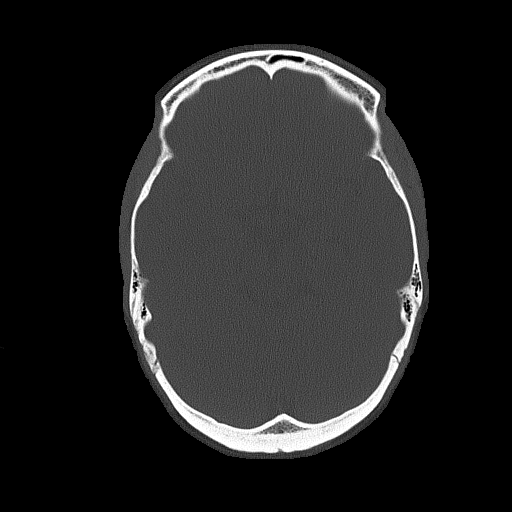

[Series 4: coronal soft · coronal · 0.28mm/px · 3 of 67 slices shown]
[im 23/67  brain]
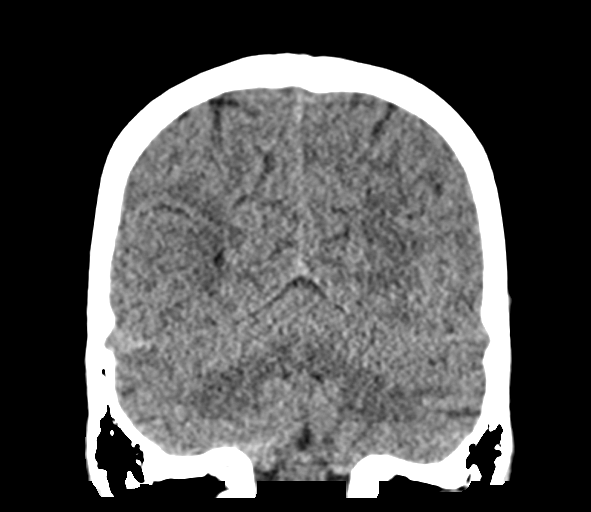
[im 30/67  brain]
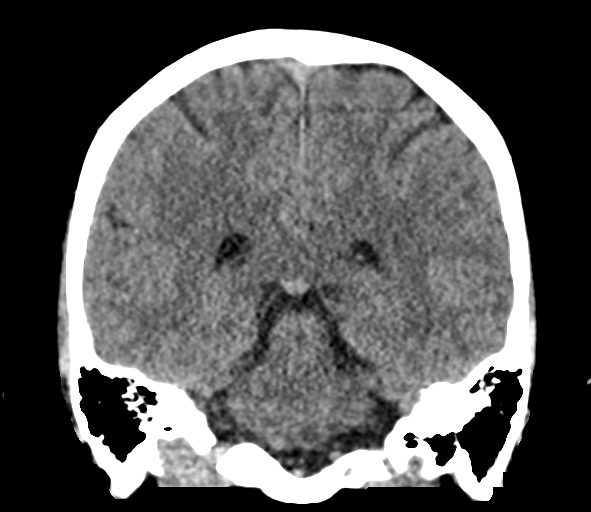
[im 37/67  brain]
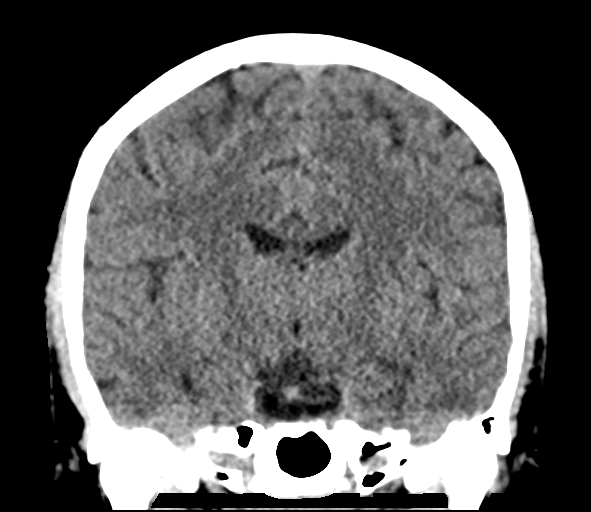

[Series 5: sag soft · sagittal · 0.29mm/px · 3 of 55 slices shown]
[im 19/55  brain]
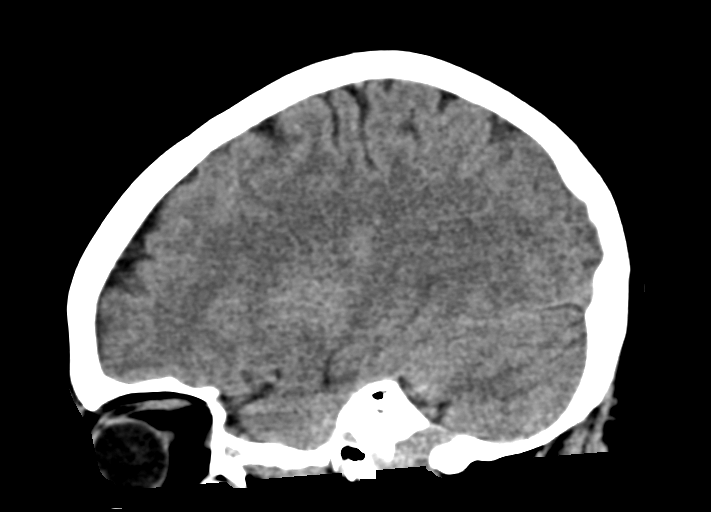
[im 28/55  brain]
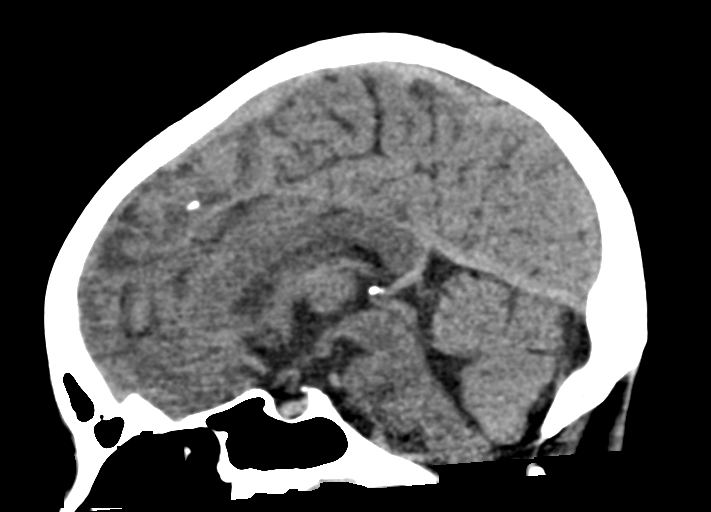
[im 37/55  brain]
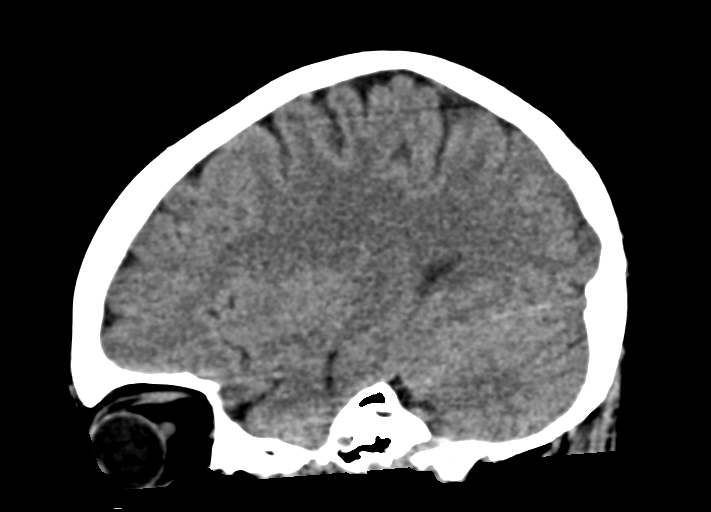

[16 of 47 positions shown; findings below may reference images not displayed]

FINDINGS: Brain: No evidence of acute infarction, hemorrhage, hydrocephalus,
extra-axial collection, visible mass lesion or mass effect.

Vascular: No hyperdense vessel or unexpected calcification.

Skull: No calvarial fracture or suspicious osseous lesion. No scalp
swelling or hematoma.

Sinuses/Orbits: Mural thickening within the right ethmoid frontal
and maxillary sinuses. No layering air-fluid levels or pneumatized
secretions. Rightward nasal septal deviation. Remaining paranasal
sinuses and mastoid air cells are clear. Middle ear cavities are
clear.

Other: None.
IMPRESSION: No acute intracranial abnormality.

Right frontal, ethmoidal and maxillary sinus disease albeit without
acute layering air-fluid levels or pneumatized secretions.

Rightward deviation of the nasal septum.

## 2021-07-15 NOTE — L&D Delivery Note (Signed)
Delivery Note ?At 11:21 AM a viable and healthy female was delivered via Vaginal, Spontaneous (Presentation: Right Occiput Anterior).  APGAR: 6, 8; weight 6 lb 4.5 oz (2850 g).   ?Placenta status: Pathology;Spontaneous, Intact.  Cord: 3 vessels with the following complications: None.   ? ?Anesthesia: Epidural;Local ?Episiotomy: None ?Lacerations: Periurethral ?Suture Repair:  4.0 monocryl ?Est. Blood Loss (mL): 100 ? ?Mom to postpartum.  Baby to Couplet care / Skin to Skin. ? ?Nichole Hicks is a 26 y.o. female G1P0101 with IUP at [redacted]w[redacted]d admitted for PPROM.  She progressed with Cytotec for augmentation to complete then had Pitocin while pushing and pushed intermittently ~ 2 hours to deliver.  Cord clamping delayed by 1-3 minutes then clamped by CNM and cut by FOB.  Placenta intact and spontaneous, bleeding minimal.  Periurethral laceration repaired without difficulty.  Mom and baby stable prior to transfer to postpartum. She plans on breastfeeding. She requests POPs for birth control.  ? ?[name] ?11/28/2021, 12:57 PM ? ? ? ?

## 2021-10-15 ENCOUNTER — Encounter: Payer: Self-pay | Admitting: Obstetrics & Gynecology

## 2021-10-15 ENCOUNTER — Ambulatory Visit (INDEPENDENT_AMBULATORY_CARE_PROVIDER_SITE_OTHER): Payer: Medicaid Other | Admitting: Obstetrics & Gynecology

## 2021-10-15 VITALS — BP 122/78 | HR 81 | Wt 231.0 lb

## 2021-10-15 DIAGNOSIS — Z3403 Encounter for supervision of normal first pregnancy, third trimester: Secondary | ICD-10-CM | POA: Diagnosis not present

## 2021-10-15 DIAGNOSIS — O219 Vomiting of pregnancy, unspecified: Secondary | ICD-10-CM

## 2021-10-15 DIAGNOSIS — O99210 Obesity complicating pregnancy, unspecified trimester: Secondary | ICD-10-CM | POA: Diagnosis not present

## 2021-10-15 DIAGNOSIS — Z3A30 30 weeks gestation of pregnancy: Secondary | ICD-10-CM

## 2021-10-15 DIAGNOSIS — Z3490 Encounter for supervision of normal pregnancy, unspecified, unspecified trimester: Secondary | ICD-10-CM | POA: Insufficient documentation

## 2021-10-15 MED ORDER — ONDANSETRON 4 MG PO TBDP: 4.0000 mg | ORAL_TABLET | Freq: Four times a day (QID) | ORAL | 2 refills | Status: DC | PRN

## 2021-10-15 MED ORDER — PANTOPRAZOLE SODIUM 40 MG PO TBEC: 40.0000 mg | DELAYED_RELEASE_TABLET | Freq: Every day | ORAL | 3 refills | Status: DC

## 2021-10-15 NOTE — Patient Instructions (Signed)
?  Considering Waterbirth? ?Guide for patients at [facility] ([facility]) ?Why consider waterbirth? ?Gentle birth for babies  ?Less pain medicine used in labor  ?May allow for passive descent/less pushing  ?May reduce perineal tears  ?More mobility and instinctive maternal position changes  ?Increased maternal relaxation  ? ?Is waterbirth safe? What are the risks of infection, drowning or other complications? ?Infection:  ?Very low risk (3.7 % for tub vs 4.8% for bed)  ?7 in 8000 waterbirths with documented infection  ?Poorly cleaned equipment most common cause  ?Slightly lower group B strep transmission rate  ?Drowning  ?Maternal:  ?Very low risk  ?Related to seizures or fainting  ?Newborn:  ?Very low risk. No evidence of increased risk of respiratory problems in multiple large studies  ?Physiological protection from breathing under water  ?Avoid underwater birth if there are any fetal complications  ?Once baby's head is out of the water, keep it out.  ?Birth complication  ?Some reports of cord trauma, but risk decreased by bringing baby to surface gradually  ?No evidence of increased risk of shoulder dystocia. Mothers can usually change positions faster in water than in a bed, possibly aiding the maneuvers to free the shoulder.  ? ?There are 2 things you MUST do to have a waterbirth with [facility]: ?Attend a waterbirth class at [facility]   ?3rd Wednesday of every month from 7-9 pm (virtual during COVID) ?Free ?Register online at [email or web address] or [email or web address] or by calling [phone] ?Bring us the certificate from the class to your prenatal appointment or send via MyChart ?Meet with a midwife at 36 weeks* to see if you can still plan a waterbirth and to sign the consent.  ? ?*We also recommend that you schedule as many of your prenatal visits with a midwife as possible.   ? ?Helpful information: ?You may want to bring a bathing suit top to the hospital  to wear during labor but this is optional.  All other supplies are provided by the hospital. ?Please arrive at the hospital with signs of active labor, and do not wait at home until late in labor. It takes 45 min- 2 hours for COVID testing, fetal monitoring, and check in to your room to take place, plus transport and filling of the waterbirth tub.   ? ?Things that would prevent you from having a waterbirth: ?Unknown or Positive COVID-19 diagnosis upon admission to hospital* ?Premature, <37wks  ?Previous cesarean birth  ?Presence of thick meconium-stained fluid  ?Multiple gestation (Twins, triplets, etc.)  ?Uncontrolled diabetes or gestational diabetes requiring medication  ?Hypertension diagnosed in pregnancy or preexisting hypertension (gestational hypertension, preeclampsia, or chronic hypertension) ?Fetal growth restriction (your baby measures less than 10th percentile on ultrasound) ?Heavy vaginal bleeding  ?Non-reassuring fetal heart rate  ?Active infection (MRSA, etc.). Group B Strep is NOT a contraindication for waterbirth.  ?If your labor has to be induced and induction method requires continuous monitoring of the baby's heart rate  ?Other risks/issues identified by your obstetrical provider  ? ?Please remember that birth is unpredictable. Under certain unforeseeable circumstances your provider may advise against giving birth in the tub. These decisions will be made on a case-by-case basis and with the safety of you and your baby as our highest priority. ? ? ?*Please remember that in order to have a waterbirth, you must test Negative to COVID-19 upon admission to the hospital. ? ?Updated 10/23/20 ? ?

## 2021-10-15 NOTE — Progress Notes (Signed)
? ?  PRENATAL VISIT NOTE ? ?Subjective:  ?Nichole Hicks is a 26 y.o. G1P0000 at [redacted]w[redacted]d being seen today for transfer of prenatal care from Dr. Shawnie Pons. She wants a Systems developer.  She is currently monitored for the following issues for this low-risk pregnancy and has Moderate persistent asthma; Obesity in pregnancy, antepartum; and Supervision of normal pregnancy on their problem list. ? ?Patient reports heartburn and nausea, wants medication.  Contractions: Not present. Vag. Bleeding: None.  Movement: Present. Denies leaking of fluid.  ? ?The following portions of the patient's history were reviewed and updated as appropriate: allergies, current medications, past family history, past medical history, past social history, past surgical history and problem list.  ? ?Objective:  ? ?Vitals:  ? 10/15/21 0918  ?BP: 122/78  ?Pulse: 81  ?Weight: 231 lb (104.8 kg)  ? ? ?Fetal Status: Fetal Heart Rate (bpm): 131   Movement: Present    ? ?General:  Alert, oriented and cooperative. Patient is in no acute distress.  ?Skin: Skin is warm and dry. No rash noted.   ?Cardiovascular: Normal heart rate noted  ?Respiratory: Normal respiratory effort, no problems with respiration noted  ?Abdomen: Soft, gravid, appropriate for gestational age.  Pain/Pressure: Absent     ?Pelvic: Cervical exam deferred        ?Extremities: Normal range of motion.  Edema: Trace  ?Mental Status: Normal mood and affect. Normal behavior. Normal judgment and thought content.  ? ?Assessment and Plan:  ?Pregnancy: G1P0000 at [redacted]w[redacted]d ?1. Obesity in pregnancy, antepartum ?TWG 1 lb. Growth scan ordered. May need weekly BPPs starting at 34 weeks if BMI >40. ?- [place] MFM OB DETAIL +14 WK; Future ? ?2. Nausea and vomiting of pregnancy, antepartum ?Antiemetics prescribed, also prescribed Protonix ?- ondansetron (ZOFRAN-ODT) 4 MG disintegrating tablet; Take 1 tablet (4 mg total) by mouth every 6 (six) hours as needed for nausea.  Dispense: 20 tablet; Refill: 2 ?- pantoprazole  (PROTONIX) 40 MG tablet; Take 1 tablet (40 mg total) by mouth daily.  Dispense: 30 tablet; Refill: 3 ? ?3. [redacted] weeks gestation of pregnancy ?4. Encounter for supervision of normal first pregnancy in third trimester ?Information given about waterbirth, will meet with CNM next time ?Multiple questions answered. ?Preterm labor symptoms and general obstetric precautions including but not limited to vaginal bleeding, contractions, leaking of fluid and fetal movement were reviewed in detail with the patient. ?Please refer to After Visit Summary for other counseling recommendations.  ? ?Return in about 2 weeks (around 10/29/2021) for OFFICE OB VISIT with CNM to discuss waterbirth. ? ?No future appointments. ? ?Jaynie Collins, MD ? ?

## 2021-10-30 ENCOUNTER — Encounter: Payer: Self-pay | Admitting: *Deleted

## 2021-10-30 ENCOUNTER — Telehealth: Payer: Self-pay | Admitting: *Deleted

## 2021-10-30 ENCOUNTER — Ambulatory Visit: Payer: Medicaid Other | Admitting: *Deleted

## 2021-10-30 ENCOUNTER — Encounter: Payer: PRIVATE HEALTH INSURANCE | Admitting: Advanced Practice Midwife

## 2021-10-30 ENCOUNTER — Encounter: Payer: Medicaid Other | Admitting: Advanced Practice Midwife

## 2021-10-30 ENCOUNTER — Ambulatory Visit: Payer: Medicaid Other | Attending: Obstetrics & Gynecology

## 2021-10-30 ENCOUNTER — Encounter: Payer: Self-pay | Admitting: Obstetrics & Gynecology

## 2021-10-30 VITALS — HR 108

## 2021-10-30 DIAGNOSIS — O210 Mild hyperemesis gravidarum: Secondary | ICD-10-CM | POA: Diagnosis not present

## 2021-10-30 DIAGNOSIS — O99213 Obesity complicating pregnancy, third trimester: Secondary | ICD-10-CM | POA: Diagnosis present

## 2021-10-30 DIAGNOSIS — O219 Vomiting of pregnancy, unspecified: Secondary | ICD-10-CM

## 2021-10-30 DIAGNOSIS — Z6839 Body mass index (BMI) 39.0-39.9, adult: Secondary | ICD-10-CM

## 2021-10-30 DIAGNOSIS — E669 Obesity, unspecified: Secondary | ICD-10-CM | POA: Insufficient documentation

## 2021-10-30 DIAGNOSIS — O3663X0 Maternal care for excessive fetal growth, third trimester, not applicable or unspecified: Secondary | ICD-10-CM | POA: Insufficient documentation

## 2021-10-30 DIAGNOSIS — Z3A30 30 weeks gestation of pregnancy: Secondary | ICD-10-CM | POA: Diagnosis not present

## 2021-10-30 DIAGNOSIS — Z3A32 32 weeks gestation of pregnancy: Secondary | ICD-10-CM | POA: Diagnosis not present

## 2021-10-30 DIAGNOSIS — Z363 Encounter for antenatal screening for malformations: Secondary | ICD-10-CM | POA: Insufficient documentation

## 2021-10-30 DIAGNOSIS — Z3403 Encounter for supervision of normal first pregnancy, third trimester: Secondary | ICD-10-CM | POA: Insufficient documentation

## 2021-10-30 DIAGNOSIS — O99210 Obesity complicating pregnancy, unspecified trimester: Secondary | ICD-10-CM | POA: Insufficient documentation

## 2021-10-30 DIAGNOSIS — J4541 Moderate persistent asthma with (acute) exacerbation: Secondary | ICD-10-CM

## 2021-10-30 MED ORDER — ONDANSETRON 4 MG PO TBDP: 4.0000 mg | ORAL_TABLET | Freq: Four times a day (QID) | ORAL | 2 refills | Status: DC | PRN

## 2021-10-30 MED ORDER — PANTOPRAZOLE SODIUM 40 MG PO TBEC: 40.0000 mg | DELAYED_RELEASE_TABLET | Freq: Every day | ORAL | 3 refills | Status: DC

## 2021-10-30 NOTE — Telephone Encounter (Signed)
Pt called requesting that her Protonix and Zofran be sent to [facility] due to difference in cost.  RX sent to [facility]. ?

## 2021-10-30 NOTE — Telephone Encounter (Signed)
Ms Allsbrook's voice mail is full. She left before scheduling next week appointment.  I left a message on husband's voice mail for her to call MFM as soon as possible to schedule next week's ordered study. ?

## 2021-10-30 NOTE — Progress Notes (Signed)
When patient was seen in [facility] today, she requested that her BP not be taken until the end of her visit because she has been vomiting this AM. Patient had U/S then U/S tech requested RN to take patient BP. Patient was in the restroom when RN went to room. When patient came out, BP was taken. Patient said the reading was not accurate because she had been vomiting earlier and did not feel well. She was insistent that the reading not be recorded in her chart. To comply with patient wishes, BP was not documented in vital signs, but MD was informed of BP reading. ?

## 2021-10-30 NOTE — Progress Notes (Deleted)
   PRENATAL VISIT NOTE  Subjective:  Nichole Hicks is a 26 y.o. G1P0000 at [redacted]w[redacted]d being seen today for ongoing prenatal care.  She is currently monitored for the following issues for this {Blank single:19197::"high-risk","low-risk"} pregnancy and has Moderate persistent asthma; Obesity in pregnancy, antepartum; and Supervision of normal pregnancy on their problem list.  Patient reports {sx:14538}.   .  .   . Denies leaking of fluid.   The following portions of the patient's history were reviewed and updated as appropriate: allergies, current medications, past family history, past medical history, past social history, past surgical history and problem list.   Objective:  There were no vitals filed for this visit.  Fetal Status:           General:  Alert, oriented and cooperative. Patient is in no acute distress.  Skin: Skin is warm and dry. No rash noted.   Cardiovascular: Normal heart rate noted  Respiratory: Normal respiratory effort, no problems with respiration noted  Abdomen: Soft, gravid, appropriate for gestational age.        Pelvic: {Blank single:19197::"Cervical exam performed in the presence of a chaperone","Cervical exam deferred"}        Extremities: Normal range of motion.     Mental Status: Normal mood and affect. Normal behavior. Normal judgment and thought content.   Assessment and Plan:  Pregnancy: G1P0000 at [redacted]w[redacted]d 1. Encounter for supervision of normal first pregnancy in third trimester *** --Desires WB  2. Obesity in pregnancy, antepartum ***  3. Nausea and vomiting of pregnancy, antepartum ***  4. [redacted] weeks gestation of pregnancy ***  {Blank single:19197::"Term","Preterm"} labor symptoms and general obstetric precautions including but not limited to vaginal bleeding, contractions, leaking of fluid and fetal movement were reviewed in detail with the patient. Please refer to After Visit Summary for other counseling recommendations.   No follow-ups on  file.  Future Appointments  Date Time Provider Department Center  10/30/2021  9:10 AM Leftwich-Kirby, Wilmer Floor, CNM [facility]    Sharen Counter, CNM

## 2021-10-31 ENCOUNTER — Other Ambulatory Visit: Payer: Self-pay | Admitting: *Deleted

## 2021-10-31 DIAGNOSIS — O99213 Obesity complicating pregnancy, third trimester: Secondary | ICD-10-CM

## 2021-11-06 ENCOUNTER — Ambulatory Visit (INDEPENDENT_AMBULATORY_CARE_PROVIDER_SITE_OTHER): Payer: Medicaid Other

## 2021-11-06 ENCOUNTER — Encounter: Payer: Self-pay | Admitting: Obstetrics & Gynecology

## 2021-11-06 ENCOUNTER — Telehealth: Payer: Self-pay | Admitting: *Deleted

## 2021-11-06 VITALS — BP 137/85 | HR 99 | Wt 238.0 lb

## 2021-11-06 DIAGNOSIS — Z3A33 33 weeks gestation of pregnancy: Secondary | ICD-10-CM | POA: Diagnosis not present

## 2021-11-06 DIAGNOSIS — Z3403 Encounter for supervision of normal first pregnancy, third trimester: Secondary | ICD-10-CM

## 2021-11-06 DIAGNOSIS — O99210 Obesity complicating pregnancy, unspecified trimester: Secondary | ICD-10-CM

## 2021-11-06 NOTE — Progress Notes (Signed)
? ?  PRENATAL VISIT NOTE ? ?Subjective:  ?Nichole Hicks is a 26 y.o. G1P0000 at [redacted]w[redacted]d being seen today for ongoing prenatal care.  She is currently monitored for the following issues for this low-risk pregnancy and has Moderate persistent asthma; Maternal morbid obesity, antepartum (HCC); Supervision of normal pregnancy; and Large for gestational age fetus, third trimester on their problem list. ? ?Patient reports no complaints.  Contractions: Not present. Vag. Bleeding: None.  Movement: Present. Denies leaking of fluid.  ? ?The following portions of the patient's history were reviewed and updated as appropriate: allergies, current medications, past family history, past medical history, past social history, past surgical history and problem list.  ? ?Objective:  ? ?Vitals:  ? 11/06/21 0814  ?BP: 137/85  ?Pulse: 99  ?Weight: 238 lb (108 kg)  ? ? ?Fetal Status: Fetal Heart Rate (bpm): 140 Fundal Height: 34 cm Movement: Present    ? ?General:  Alert, oriented and cooperative. Patient is in no acute distress.  ?Skin: Skin is warm and dry. No rash noted.   ?Cardiovascular: Normal heart rate noted  ?Respiratory: Normal respiratory effort, no problems with respiration noted  ?Abdomen: Soft, gravid, appropriate for gestational age.  Pain/Pressure: Absent     ?Pelvic: Cervical exam deferred        ?Extremities: Normal range of motion.  Edema: Trace  ?Mental Status: Normal mood and affect. Normal behavior. Normal judgment and thought content.  ? ?Assessment and Plan:  ?Pregnancy: G1P0000 at [redacted]w[redacted]d ?1. Encounter for supervision of normal first pregnancy in third trimester ?- Routine OB. Doing well, no concerns ?- Interested in waterbirth, questions answered. Pt has completed waterbirth class. Instructed to upload certificate into MyChart. Discussed waterbirth as option for low risk pregnancy. Pregnancy is hard to predict and things may arise that prevent immersion in water but labor can be supported with freedom of movement,  birthing ball, shower and birth can be supported in position of pt choosing, etc, even if waterbirth becomes unavailable. Pt states understanding. ?- Will need to sign consent at later appointment ?- Cultures at next visit. Patient also desires to get Tdap at that visit as well, as she had Tdap vaccine in August ? ?2. [redacted] weeks gestation of pregnancy ?- Endorses active fetal movement ?- FH appropriate ? ?3. Maternal morbid obesity, antepartum (HCC) ?- TWG 8lb ?- EFW 2619g, 97% on 4/18. Has follow up growth [place] and NST with MFM scheduled ? ? ?Preterm labor symptoms and general obstetric precautions including but not limited to vaginal bleeding, contractions, leaking of fluid and fetal movement were reviewed in detail with the patient. ?Please refer to After Visit Summary for other counseling recommendations.  ? ?Return in about 2 weeks (around 11/20/2021). ? ?Future Appointments  ?Date Time Provider Department Center  ?11/15/2021  8:15 AM [facility] NST [facility]  ?11/22/2021  8:15 AM [facility]  ?11/27/2021  8:00 AM Brand Males, CNM [facility]  ?11/29/2021  8:15 AM [facility] NURSE [facility]  ?11/29/2021  8:30 AM [facility] US2 [facility]  ? ? ?Brand Males, CNM ? ?

## 2021-11-06 NOTE — Telephone Encounter (Signed)
Advised patient that the office does not do BPPs in the office. She would like to know what a BPP is. Told patient that her question would be forwarded to a clinical staff member for an answer via MyChart. Patient agreed. ?

## 2021-11-06 NOTE — Patient Instructions (Signed)

## 2021-11-15 ENCOUNTER — Encounter: Payer: Medicaid Other | Admitting: General Practice

## 2021-11-22 ENCOUNTER — Ambulatory Visit: Payer: Medicaid Other | Admitting: *Deleted

## 2021-11-22 ENCOUNTER — Ambulatory Visit (INDEPENDENT_AMBULATORY_CARE_PROVIDER_SITE_OTHER): Payer: Medicaid Other

## 2021-11-22 VITALS — BP 154/90 | HR 92 | Wt 242.0 lb

## 2021-11-22 DIAGNOSIS — O99210 Obesity complicating pregnancy, unspecified trimester: Secondary | ICD-10-CM

## 2021-11-22 LAB — POCT URINALYSIS DIP (DEVICE)
Bilirubin Urine: NEGATIVE
Glucose, UA: NEGATIVE mg/dL
Hgb urine dipstick: NEGATIVE
Ketones, ur: NEGATIVE mg/dL
Nitrite: NEGATIVE
Protein, ur: NEGATIVE mg/dL
Specific Gravity, Urine: 1.025 (ref 1.005–1.030)
Urobilinogen, UA: 0.2 mg/dL (ref 0.0–1.0)
pH: 7 (ref 5.0–8.0)

## 2021-11-22 NOTE — Progress Notes (Signed)
Pt informed that the ultrasound is considered a limited OB ultrasound and is not intended to be a complete ultrasound exam.  Patient also informed that the ultrasound is not being completed with the intent of assessing for fetal or placental anomalies or any pelvic abnormalities.  Explained that the purpose of today?s ultrasound is to assess for presentation, BPP and amniotic fluid volume.  Patient acknowledges the purpose of the exam and the limitations of the study.   ? ?Pt denies H/A, blurry vision, dizziness and scotoma.  Urine protein - negative.  Consult w/Dr. Alysia Penna - pt to keep appt as scheduled on 5/16.  Pt was advised of plan of care and she voiced understanding.  ? ?

## 2021-11-26 ENCOUNTER — Inpatient Hospital Stay (HOSPITAL_COMMUNITY)
Admission: AD | Admit: 2021-11-26 | Discharge: 2021-11-30 | DRG: 807 | Disposition: A | Discharge status: Home / Self Care | Payer: Medicaid Other | Attending: Obstetrics and Gynecology | Admitting: Obstetrics and Gynecology

## 2021-11-26 ENCOUNTER — Other Ambulatory Visit: Payer: Self-pay

## 2021-11-26 ENCOUNTER — Encounter (HOSPITAL_COMMUNITY): Payer: Self-pay | Admitting: Obstetrics and Gynecology

## 2021-11-26 DIAGNOSIS — O42919 Preterm premature rupture of membranes, unspecified as to length of time between rupture and onset of labor, unspecified trimester: Secondary | ICD-10-CM

## 2021-11-26 DIAGNOSIS — Z3A36 36 weeks gestation of pregnancy: Secondary | ICD-10-CM | POA: Diagnosis not present

## 2021-11-26 DIAGNOSIS — O99214 Obesity complicating childbirth: Secondary | ICD-10-CM | POA: Diagnosis present

## 2021-11-26 DIAGNOSIS — O42019 Preterm premature rupture of membranes, onset of labor within 24 hours of rupture, unspecified trimester: Secondary | ICD-10-CM

## 2021-11-26 DIAGNOSIS — O42913 Preterm premature rupture of membranes, unspecified as to length of time between rupture and onset of labor, third trimester: Secondary | ICD-10-CM | POA: Diagnosis present

## 2021-11-26 DIAGNOSIS — O26893 Other specified pregnancy related conditions, third trimester: Secondary | ICD-10-CM | POA: Diagnosis present

## 2021-11-26 DIAGNOSIS — O4292 Full-term premature rupture of membranes, unspecified as to length of time between rupture and onset of labor: Secondary | ICD-10-CM | POA: Diagnosis present

## 2021-11-26 HISTORY — DX: Anxiety disorder, unspecified: F41.9

## 2021-11-26 LAB — AMNISURE RUPTURE OF MEMBRANE (ROM) NOT AT ARMC: Amnisure ROM: POSITIVE

## 2021-11-26 MED ORDER — FENTANYL CITRATE (PF) 100 MCG/2ML IJ SOLN
100.0000 ug | INTRAMUSCULAR | Status: DC | PRN
  Administered 2021-11-27 (×2): 100 ug via INTRAVENOUS
  Filled 2021-11-26 (×2): qty 2

## 2021-11-26 MED ORDER — LIDOCAINE HCL (PF) 1 % IJ SOLN: 30.0000 mL | INTRAMUSCULAR | Status: DC | PRN

## 2021-11-26 MED ORDER — DIPHENHYDRAMINE HCL 25 MG PO CAPS
25.0000 mg | ORAL_CAPSULE | Freq: Four times a day (QID) | ORAL | Status: DC | PRN
  Administered 2021-11-27: 25 mg via ORAL
  Filled 2021-11-26: qty 1

## 2021-11-26 MED ORDER — OXYCODONE-ACETAMINOPHEN 5-325 MG PO TABS: 2.0000 | ORAL_TABLET | ORAL | Status: DC | PRN

## 2021-11-26 MED ORDER — SOD CITRATE-CITRIC ACID 500-334 MG/5ML PO SOLN: 30.0000 mL | ORAL | Status: DC | PRN

## 2021-11-26 MED ORDER — OXYTOCIN BOLUS FROM INFUSION: 333.0000 mL | Freq: Once | INTRAVENOUS | Status: DC

## 2021-11-26 MED ORDER — LACTATED RINGERS IV SOLN
500.0000 mL | INTRAVENOUS | Status: DC | PRN
  Administered 2021-11-28: 500 mL via INTRAVENOUS

## 2021-11-26 MED ORDER — ACETAMINOPHEN 325 MG PO TABS: 650.0000 mg | ORAL_TABLET | ORAL | Status: DC | PRN

## 2021-11-26 MED ORDER — ONDANSETRON HCL 4 MG/2ML IJ SOLN
4.0000 mg | Freq: Four times a day (QID) | INTRAMUSCULAR | Status: DC | PRN
Start: 2021-11-26 — End: 2021-11-27

## 2021-11-26 MED ORDER — FLEET ENEMA 7-19 GM/118ML RE ENEM: 1.0000 | ENEMA | RECTAL | Status: DC | PRN

## 2021-11-26 MED ORDER — OXYTOCIN-SODIUM CHLORIDE 30-0.9 UT/500ML-% IV SOLN
2.5000 [IU]/h | INTRAVENOUS | Status: DC
  Filled 2021-11-26: qty 500

## 2021-11-26 MED ORDER — OXYCODONE-ACETAMINOPHEN 5-325 MG PO TABS: 1.0000 | ORAL_TABLET | ORAL | Status: DC | PRN

## 2021-11-26 MED ORDER — HYDROXYZINE HCL 50 MG PO TABS: 50.0000 mg | ORAL_TABLET | Freq: Four times a day (QID) | ORAL | Status: DC | PRN

## 2021-11-26 MED ORDER — PROMETHAZINE HCL 12.5 MG PO TABS
12.5000 mg | ORAL_TABLET | Freq: Once | ORAL | Status: AC
  Administered 2021-11-27: 12.5 mg via ORAL
  Filled 2021-11-26: qty 1

## 2021-11-26 NOTE — MAU Note (Signed)
Pt says at 2 pm- today felt liquid  come out - unsure from where - thinks it was urine  but family wanted her to come in  ?[facility] ?Felt cramping today ? ? ?

## 2021-11-26 NOTE — Progress Notes (Signed)
Pt refused to be swabbed for the amniosure. She referred doing it herself. Pt her swabbed. Results pending.  ?

## 2021-11-26 NOTE — H&P (Signed)
OBSTETRIC ADMISSION HISTORY AND PHYSICAL ? ?Nichole Hicks is a 26 y.o. female G1P0000 with IUP at [redacted]w[redacted]d presenting for leaking of fluid starting at 2pm today, a large gush followed by a few smaller leaks. Did not want to come in but her family made her. Was planning a waterbirth and did not want to be risked out. Had some mild cramping today but nothing too uncomfortable. Has not slept well in the past few nights and wants to nap prior to getting things started if contractions do not start on their own. She reports +FMs. No VB, blurry vision, headaches, peripheral edema, or RUQ pain. She plans on breastfeeding. She requests POPs for birth control. ? ?Dating: By Milus Banister anatomy scan at Dr. Tawni Levy office --->  Estimated Date of Delivery: 12/20/21 (first u/s dated her for an EDD of 12/03/21 per pt) ? ?Sono:   ? ?@[redacted]w[redacted]d , normal anatomy, cephalic presentation, 2619g, 03-03-1986, EFW 5lb 12oz ? ? ?Prenatal History/Complications: ?None ? ?Past Medical History: ?Past Medical History:  ?Diagnosis Date  ? Asthma due to seasonal allergies   ? Collapsed lung, due to flu at age 89   ? Dysmenorrhea   ? Migraines   ? Moderate persistent asthma   ? Multiple fractures   ? ?Past Surgical History: ?Past Surgical History:  ?Procedure Laterality Date  ? broken bone    ? WISDOM TOOTH EXTRACTION    ? ?Obstetrical History: ?OB History   ? ? Gravida  ?1  ? Para  ?0  ? Term  ?0  ? Preterm  ?0  ? AB  ?0  ? Living  ?0  ?  ? ? SAB  ?0  ? IAB  ?0  ? Ectopic  ?0  ? Multiple  ?0  ? Live Births  ?0  ?   ?  ?  ? ?Social History: ?Social History  ? ?Socioeconomic History  ? Marital status: Married  ?  Spouse name: Not on file  ? Number of children: Not on file  ? Years of education: Not on file  ? Highest education level: Not on file  ?Occupational History  ? Not on file  ?Tobacco Use  ? Smoking status: Never  ? Smokeless tobacco: Never  ?Vaping Use  ? Vaping Use: Never used  ?Substance and Sexual Activity  ? Alcohol use: Yes  ?  Alcohol/week: 2.0 standard  drinks  ?  Types: 2 Glasses of wine per week  ? Drug use: No  ? Sexual activity: Yes  ?Other Topics Concern  ? Not on file  ?Social History Narrative  ? Not on file  ? ?Social Determinants of Health  ? ?Financial Resource Strain: Not on file  ?Food Insecurity: Not on file  ?Transportation Needs: Not on file  ?Physical Activity: Not on file  ?Stress: Not on file  ?Social Connections: Not on file  ? ?Family History: ?Family History  ?Problem Relation Age of Onset  ? Migraines Mother   ? Cancer Maternal Grandmother   ? Breast cancer Maternal Grandmother   ? Diabetes Maternal Grandfather   ? ?Allergies: ?Allergies  ?Allergen Reactions  ? Aspirin Hives  ? Ibuprofen Hives  ? ?Medications Prior to Admission  ?Medication Sig Dispense Refill Last Dose  ? cetirizine (ZYRTEC) 10 MG chewable tablet Zyrtec   11/25/2021  ? ondansetron (ZOFRAN-ODT) 4 MG disintegrating tablet Take 1 tablet (4 mg total) by mouth every 6 (six) hours as needed for nausea. 20 tablet 2 Past Month  ? pantoprazole (PROTONIX) 40 MG  tablet Take 1 tablet (40 mg total) by mouth daily. 30 tablet 3 11/25/2021  ? Prenatal Vit-Fe Fumarate-FA (PRENATAL VITAMINS PO) Take by mouth.   11/25/2021  ? albuterol (VENTOLIN HFA) 108 (90 Base) MCG/ACT inhaler Inhale 2 puffs into the lungs every 6 (six) hours as needed for wheezing. For shortness of breath 1 Inhaler 11 Unknown  ? ?Review of Systems: ?All systems reviewed and negative except as stated in HPI ? ?PE: ?Blood pressure 133/87, pulse (!) 113, temperature 98.7 ?F (37.1 ?C), temperature source Oral, resp. rate 20, height 5\' 4"  (1.626 m), weight 238 lb (108 kg), last menstrual period 03/15/2021. ?General appearance: alert, cooperative, appears stated age, and no distress ?Lungs: regular rate and effort ?Heart: regular rate  ?Abdomen: soft, non-tender ?Extremities: Homans sign is negative, no sign of DVT ?Presentation: cephalic (confirmed by bedside U/S) ?EFM: 130 bpm, moderate variability, 15x15 accels, no decels ?Toco:  UI ?  ?SVE: declined, will accept when it's time to give cytotec ? ?Prenatal labs: ?ABO, Rh:  A positive ?Antibody:  Negative ?Rubella:  Immune ?RPR:  Non-reactive ?HBsAg:  Negative ?HIV:  Negative ?GBS:  PCR collected, pending ?1hr GTT at previous OB: 136 (normal) ? ?Prenatal Transfer Tool  ?Maternal Diabetes: No ?Genetic Screening: Normal ?Maternal Ultrasounds/Referrals: Normal ?Fetal Ultrasounds or other Referrals:  None ?Maternal Substance Abuse:  No ?Significant Maternal Medications:  None ?Significant Maternal Lab Results: None ? ?Results for orders placed or performed during the hospital encounter of 11/26/21 (from the past 24 hour(s))  ?Amnisure rupture of membrane (rom)not at [facility]  ? Collection Time: 11/26/21 10:10 PM  ?Result Value Ref Range  ? Amnisure ROM POSITIVE   ? ?Patient Active Problem List  ? Diagnosis Date Noted  ? Full-term premature rupture of membranes 11/26/2021  ? Large for gestational age fetus, third trimester 10/30/2021  ? Supervision of normal pregnancy 10/15/2021  ? Maternal morbid obesity, antepartum (HCC) 12/23/2018  ? Moderate persistent asthma   ? ?Assessment: ?Nichole Hicks is a 26 y.o. G1P0000 at [redacted]w[redacted]d here for expectant management of PROM ? ?1. Labor: latent, PROM confirmed with amnisure. ?2. FWB: Cat 1 ?3. Pain: Planning unmedicated delivery, wants to use shower for hydrotherapy ?4. GBS: Pending (collected) ?  ?Plan: ?Admit to L&D for expectant management of PROM ?- Pt disappointed but understanding that she is currently risked out of waterbirth based on her GA of [redacted]w[redacted]d. Explained that she can still use hydrotherapy via the shower. ?- Reviewed options with pt including walking with or without cytotec, FB placement, or just expectant management. Pt is exhausted and wants to sleep overnight then start things in the morning. Phenergan and benadryl ordered.  ?- Declines IV, but is amenable to having a saline lock placed in the am ?- Declined vaginal exam, cephalic presentation  confirmed with bedside u/s. Pt ok with limited vaginal exams - will be fine with one prior to cytotec/walking in the morning. ?- Report called to [redacted]w[redacted]d who accepts care of pt. ? ?Cathie Beams, CNM  ?11/26/2021, 11:31 PM ? ? ? ? ?

## 2021-11-27 ENCOUNTER — Encounter (HOSPITAL_COMMUNITY): Payer: Self-pay | Admitting: Obstetrics and Gynecology

## 2021-11-27 LAB — CBC
HCT: 33.2 % — ABNORMAL LOW (ref 36.0–46.0)
Hemoglobin: 11 g/dL — ABNORMAL LOW (ref 12.0–15.0)
MCH: 28.5 pg (ref 26.0–34.0)
MCHC: 33.1 g/dL (ref 30.0–36.0)
MCV: 86 fL (ref 80.0–100.0)
Platelets: 310 10*3/uL (ref 150–400)
RBC: 3.86 MIL/uL — ABNORMAL LOW (ref 3.87–5.11)
RDW: 13.5 % (ref 11.5–15.5)
WBC: 14 10*3/uL — ABNORMAL HIGH (ref 4.0–10.5)
nRBC: 0 % (ref 0.0–0.2)

## 2021-11-27 LAB — TYPE AND SCREEN
ABO/RH(D): A POS
Antibody Screen: NEGATIVE

## 2021-11-27 LAB — GROUP B STREP BY PCR: Group B strep by PCR: NEGATIVE

## 2021-11-27 LAB — RPR: RPR Ser Ql: NONREACTIVE

## 2021-11-27 MED ORDER — SODIUM CHLORIDE 0.9 % IV SOLN
5.0000 10*6.[IU] | Freq: Once | INTRAVENOUS | Status: AC
  Administered 2021-11-27: 5 10*6.[IU] via INTRAVENOUS

## 2021-11-27 MED ORDER — TERBUTALINE SULFATE 1 MG/ML IJ SOLN: 0.2500 mg | Freq: Once | INTRAMUSCULAR | Status: DC | PRN

## 2021-11-27 MED ORDER — PENICILLIN G POT IN DEXTROSE 60000 UNIT/ML IV SOLN: 3.0000 10*6.[IU] | INTRAVENOUS | Status: DC

## 2021-11-27 MED ORDER — PENICILLIN G POT IN DEXTROSE 60000 UNIT/ML IV SOLN
3.0000 10*6.[IU] | INTRAVENOUS | Status: DC
  Administered 2021-11-28 (×2): 3 10*6.[IU] via INTRAVENOUS
  Filled 2021-11-27 (×2): qty 50

## 2021-11-27 MED ORDER — ONDANSETRON 4 MG PO TBDP
4.0000 mg | ORAL_TABLET | Freq: Three times a day (TID) | ORAL | Status: DC | PRN
  Administered 2021-11-27: 4 mg via ORAL
  Filled 2021-11-27: qty 1

## 2021-11-27 MED ORDER — LIDOCAINE HCL (PF) 1 % IJ SOLN
30.0000 mL | INTRAMUSCULAR | Status: AC | PRN
  Administered 2021-11-28: 30 mL via SUBCUTANEOUS
  Filled 2021-11-27: qty 30

## 2021-11-27 MED ORDER — MISOPROSTOL 50MCG HALF TABLET
50.0000 ug | ORAL_TABLET | ORAL | Status: DC | PRN
  Administered 2021-11-27 (×2): 50 ug via ORAL
  Filled 2021-11-27 (×3): qty 1

## 2021-11-27 MED ORDER — SODIUM CHLORIDE 0.9 % IV SOLN
5.0000 10*6.[IU] | Freq: Once | INTRAVENOUS | Status: DC
  Filled 2021-11-27: qty 5

## 2021-11-27 MED ORDER — ZOLPIDEM TARTRATE 5 MG PO TABS: 5.0000 mg | ORAL_TABLET | Freq: Every evening | ORAL | Status: DC | PRN

## 2021-11-27 NOTE — Progress Notes (Signed)
Pt does not want to start IOL or PCN now.  FHR Cat 1.  Will discuss again in a few hours.  ?

## 2021-11-27 NOTE — Progress Notes (Signed)
Patient ID: Nichole Hicks, female   DOB: 1995/10/13, 26 y.o.   MRN: [id number] ? ? ?Nichole Hicks is a 26 y.o. G1P0000 at [redacted]w[redacted]d  admitted for SROM.  ? ?Subjective: ?Patient is tearful, anxious and very concerned about interventions to start labor. She does not want to talk about pitocin;  ? ?Objective: ?Vitals:  ? 11/26/21 2327 11/27/21 0030 11/27/21 0520 11/27/21 0800  ?BP: (!) 143/72 129/90 (!) 141/71   ?Pulse: 86 (!) 102 97   ?Resp: 16 16 16    ?Temp: 98 ?F (36.7 ?C)  97.6 ?F (36.4 ?C) 97.6 ?F (36.4 ?C)  ?TempSrc: Oral  Oral Oral  ?Weight:      ?Height:      ? ?Total I/O ?In: 150 [P.O.:150] ?Out: -  ? ?FHT:  FHR: 130 bpm, variability: moderate,  accelerations:  Present,  decelerations:  Absent ?UC:   quiescent ?SVE:    declined  ?Pitocin @ 0 mu/min ? ?Labs: ?Lab Results  ?Component Value Date  ? WBC 14.0 (H) 11/26/2021  ? HGB 11.0 (L) 11/26/2021  ? HCT 33.2 (L) 11/26/2021  ? MCV 86.0 11/26/2021  ? PLT 310 11/26/2021  ? ? ?Assessment / Plan: ?Long conversation with patient about process of IOL , offering support and therapeutic listening. Patient is open to cytotec now; agrees to 50 buccally.  ? ?Labor: Patient declining cervical exam  ?Fetal Wellbeing:  Category I ?Pain Control:  Labor support without medications ?Anticipated MOD:  NSVD ? ?11/28/2021 Nichole Hicks ?11/27/2021, 11:07 AM ? ? ? ? ?

## 2021-11-27 NOTE — Progress Notes (Signed)
This encounter was created in error - please disregard.

## 2021-11-27 NOTE — Progress Notes (Signed)
Patient ID: [name], female   DOB: 05/16/1996, 26 y.o.   MRN: [id number] ?NYOMIE EHRLICH is a 26 y.o. G1P0000 at [redacted]w[redacted]d admitted for induction of labor due to PPROM  ? ?Subjective: ?uncomfortable w/ contractions and just got IV fentanyl ? ?Objective: ?BP 132/74   Pulse (!) 103   Temp 98.6 ?F (37 ?C) (Axillary)   Resp 17   Ht 5\' 4"  (1.626 m)   Wt 108 kg   LMP 03/15/2021   BMI 40.85 kg/m?  ?No intake/output data recorded. ? ?FHR baseline 130 bpm, Variability: moderate, Accelerations:present, Decelerations:  Absent ?Toco: regular, every 2 minutes  ? ?Labs: ?Lab Results  ?Component Value Date  ? WBC 14.0 (H) 11/26/2021  ? HGB 11.0 (L) 11/26/2021  ? HCT 33.2 (L) 11/26/2021  ? MCV 86.0 11/26/2021  ? PLT 310 11/26/2021  ? ? ?Assessment / Plan: ?IOL d/t PPROM (5/12 @ 1400), s/p cytotec x2 (last dose 1540), contracting regularly on her own and uncomfortable w/ contractions. Just got 1 dose IV fentanyl. Expectant management for now. Declines SVE. Is ok proceeding w/ IV PCN for GBS unknown status/PPROM.  ? ?Labor: early ?Fetal Wellbeing:  Category I ?Pain Control:  IV pain meds ?Pre-eclampsia: N/A ?I/D:   starting PCN for GBS unknown/PPROM ?Anticipated MOD: NSVB ? ?05-02-2000 CNM, WHNP-BC ?11/27/2021, 10:23 PM  ?

## 2021-11-28 ENCOUNTER — Inpatient Hospital Stay (HOSPITAL_COMMUNITY): Payer: Medicaid Other | Admitting: Anesthesiology

## 2021-11-28 ENCOUNTER — Encounter (HOSPITAL_COMMUNITY): Payer: Self-pay | Admitting: Obstetrics and Gynecology

## 2021-11-28 DIAGNOSIS — Z3A36 36 weeks gestation of pregnancy: Secondary | ICD-10-CM

## 2021-11-28 DIAGNOSIS — O42913 Preterm premature rupture of membranes, unspecified as to length of time between rupture and onset of labor, third trimester: Secondary | ICD-10-CM

## 2021-11-28 MED ORDER — FENTANYL-BUPIVACAINE-NACL 0.5-0.125-0.9 MG/250ML-% EP SOLN: 12.0000 mL/h | EPIDURAL | Status: DC | PRN

## 2021-11-28 MED ORDER — ZOLPIDEM TARTRATE 5 MG PO TABS: 5.0000 mg | ORAL_TABLET | Freq: Every evening | ORAL | Status: DC | PRN

## 2021-11-28 MED ORDER — FENTANYL-BUPIVACAINE-NACL 0.5-0.125-0.9 MG/250ML-% EP SOLN
EPIDURAL | Status: AC
  Filled 2021-11-28: qty 250

## 2021-11-28 MED ORDER — COCONUT OIL OIL: 1.0000 "application " | TOPICAL_OIL | Status: DC | PRN

## 2021-11-28 MED ORDER — ONDANSETRON HCL 4 MG/2ML IJ SOLN: 4.0000 mg | INTRAMUSCULAR | Status: DC | PRN

## 2021-11-28 MED ORDER — PHENYLEPHRINE 80 MCG/ML (10ML) SYRINGE FOR IV PUSH (FOR BLOOD PRESSURE SUPPORT)
80.0000 ug | PREFILLED_SYRINGE | INTRAVENOUS | Status: DC | PRN
  Administered 2021-11-28: 80 ug via INTRAVENOUS
  Filled 2021-11-28: qty 10

## 2021-11-28 MED ORDER — DIPHENHYDRAMINE HCL 50 MG/ML IJ SOLN: 12.5000 mg | INTRAMUSCULAR | Status: DC | PRN

## 2021-11-28 MED ORDER — SENNOSIDES-DOCUSATE SODIUM 8.6-50 MG PO TABS
2.0000 | ORAL_TABLET | Freq: Every day | ORAL | Status: DC
  Administered 2021-11-29 – 2021-11-30 (×2): 2 via ORAL
  Filled 2021-11-28 (×2): qty 2

## 2021-11-28 MED ORDER — TETANUS-DIPHTH-ACELL PERTUSSIS 5-2.5-18.5 LF-MCG/0.5 IM SUSY: 0.5000 mL | PREFILLED_SYRINGE | Freq: Once | INTRAMUSCULAR | Status: DC

## 2021-11-28 MED ORDER — WITCH HAZEL-GLYCERIN EX PADS
1.0000 "application " | MEDICATED_PAD | CUTANEOUS | Status: DC | PRN
  Administered 2021-11-29: 1 via TOPICAL

## 2021-11-28 MED ORDER — ACETAMINOPHEN 325 MG PO TABS
650.0000 mg | ORAL_TABLET | ORAL | Status: DC | PRN
  Administered 2021-11-29 (×2): 650 mg via ORAL
  Filled 2021-11-28 (×2): qty 2

## 2021-11-28 MED ORDER — ONDANSETRON HCL 4 MG/2ML IJ SOLN
4.0000 mg | Freq: Four times a day (QID) | INTRAMUSCULAR | Status: DC | PRN
  Administered 2021-11-28: 4 mg via INTRAVENOUS

## 2021-11-28 MED ORDER — LACTATED RINGERS IV SOLN
500.0000 mL | Freq: Once | INTRAVENOUS | Status: AC
  Administered 2021-11-28: 500 mL via INTRAVENOUS

## 2021-11-28 MED ORDER — FENTANYL-BUPIVACAINE-NACL 0.5-0.125-0.9 MG/250ML-% EP SOLN
EPIDURAL | Status: DC | PRN
  Administered 2021-11-28: 12 mL/h via EPIDURAL

## 2021-11-28 MED ORDER — OXYTOCIN-SODIUM CHLORIDE 30-0.9 UT/500ML-% IV SOLN
1.0000 m[IU]/min | INTRAVENOUS | Status: DC
  Administered 2021-11-28: 2 m[IU]/min via INTRAVENOUS

## 2021-11-28 MED ORDER — ONDANSETRON HCL 4 MG/2ML IJ SOLN
INTRAMUSCULAR | Status: AC
  Filled 2021-11-28: qty 2

## 2021-11-28 MED ORDER — DIPHENHYDRAMINE HCL 25 MG PO CAPS: 25.0000 mg | ORAL_CAPSULE | Freq: Four times a day (QID) | ORAL | Status: DC | PRN

## 2021-11-28 MED ORDER — ONDANSETRON HCL 4 MG PO TABS: 4.0000 mg | ORAL_TABLET | ORAL | Status: DC | PRN

## 2021-11-28 MED ORDER — PHENYLEPHRINE 80 MCG/ML (10ML) SYRINGE FOR IV PUSH (FOR BLOOD PRESSURE SUPPORT)
80.0000 ug | PREFILLED_SYRINGE | INTRAVENOUS | Status: AC | PRN
  Administered 2021-11-28 (×3): 80 ug via INTRAVENOUS

## 2021-11-28 MED ORDER — EPHEDRINE 5 MG/ML INJ: 10.0000 mg | INTRAVENOUS | Status: DC | PRN

## 2021-11-28 MED ORDER — ONDANSETRON HCL 4 MG/2ML IJ SOLN
4.0000 mg | Freq: Once | INTRAMUSCULAR | Status: AC
  Administered 2021-11-28: 4 mg via INTRAVENOUS
  Filled 2021-11-28: qty 2

## 2021-11-28 MED ORDER — SIMETHICONE 80 MG PO CHEW: 80.0000 mg | CHEWABLE_TABLET | ORAL | Status: DC | PRN

## 2021-11-28 MED ORDER — DIBUCAINE (PERIANAL) 1 % EX OINT: 1.0000 "application " | TOPICAL_OINTMENT | CUTANEOUS | Status: DC | PRN

## 2021-11-28 MED ORDER — PRENATAL MULTIVITAMIN CH
1.0000 | ORAL_TABLET | Freq: Every day | ORAL | Status: DC
  Administered 2021-11-29 – 2021-11-30 (×2): 1 via ORAL
  Filled 2021-11-28 (×2): qty 1

## 2021-11-28 MED ORDER — TERBUTALINE SULFATE 1 MG/ML IJ SOLN: 0.2500 mg | Freq: Once | INTRAMUSCULAR | Status: DC | PRN

## 2021-11-28 MED ORDER — BENZOCAINE-MENTHOL 20-0.5 % EX AERO
1.0000 "application " | INHALATION_SPRAY | CUTANEOUS | Status: DC | PRN
  Administered 2021-11-29: 1 via TOPICAL
  Filled 2021-11-28 (×2): qty 56

## 2021-11-28 MED ORDER — LIDOCAINE HCL (PF) 1 % IJ SOLN
INTRAMUSCULAR | Status: DC | PRN
  Administered 2021-11-28: 2 mL via EPIDURAL
  Administered 2021-11-28: 10 mL via EPIDURAL

## 2021-11-28 NOTE — Anesthesia Preprocedure Evaluation (Addendum)
Anesthesia Evaluation  ?Patient identified by MRN, date of birth, ID band ?Patient awake ? ? ? ?Reviewed: ?Allergy & Precautions, Patient's Chart, lab work & pertinent test results ? ?Airway ?Mallampati: III ? ?TM Distance: >3 FB ?Neck ROM: Full ? ? ? Dental ?no notable dental hx. ? ?  ?Pulmonary ?asthma ,  ?  ?Pulmonary exam normal ?breath sounds clear to auscultation ? ? ? ? ? ? Cardiovascular ?negative cardio ROS ?Normal cardiovascular exam ?Rhythm:Regular Rate:Normal ? ? ?  ?Neuro/Psych ? Headaches, PSYCHIATRIC DISORDERS Anxiety   ? GI/Hepatic ?negative GI ROS, Neg liver ROS,   ?Endo/Other  ?Morbid obesityBMI 41 ? Renal/GU ?negative Renal ROS  ?negative genitourinary ?  ?Musculoskeletal ?negative musculoskeletal ROS ?(+)  ? Abdominal ?(+) + obese,   ?Peds ?negative pediatric ROS ?(+)  Hematology ? ?(+) Blood dyscrasia, anemia , Hb 11, plt 310   ?Anesthesia Other Findings ? ? Reproductive/Obstetrics ?(+) Pregnancy ?Prime 36wks SROM, initially planning home birth ? ?  ? ? ? ? ? ? ? ? ? ? ? ? ? ?  ?  ? ? ? ? ? ? ? ?Anesthesia Physical ?Anesthesia Plan ? ?ASA: 3 ? ?Anesthesia Plan: Epidural  ? ?Post-op Pain Management:   ? ?Induction:  ? ?PONV Risk Score and Plan: 2 ? ?Airway Management Planned: Natural Airway ? ?Additional Equipment: None ? ?Intra-op Plan:  ? ?Post-operative Plan:  ? ?Informed Consent: I have reviewed the patients History and Physical, chart, labs and discussed the procedure including the risks, benefits and alternatives for the proposed anesthesia with the patient or authorized representative who has indicated his/her understanding and acceptance.  ? ? ? ? ? ?Plan Discussed with:  ? ?Anesthesia Plan Comments: (I had spoken to patient earlier in the night regarding policy to not combine IV narcotics and inhaled nitrous oxide, to which she was very combative and yelling at multiple staff members. I offered at that time to explain the epidural process and  risks/benefits, however she yelled at me and did not want to discuss the epidural as an option for her. ? ?Called for epidural when patient was 8cm, explained that she could not use the nitrous while I was placing the epidural which she was resistant to but eventually agreed with. Extremely tearful throughout procedure. Stating she would like some pain relief but not complete pain relief from epidural. Also refusing foley catheter insertion after epidural. )  ? ? ? ? ? ?Anesthesia Quick Evaluation ? ?

## 2021-11-28 NOTE — Progress Notes (Signed)
Patient ID: [name], female   DOB: [birth year], 26 y.o.   MRN: [id number] ?CHANCE KARAM is a 26 y.o. G1P0000 at [redacted]w[redacted]d admitted for induction of labor due to PPROM ? ?Subjective: ?Uncomfortable w/ contractions, saying she can't get a break, wants nitrous and fentanyl together- was told earlier it was ok. Very upset ? ?Objective: ?BP 132/74   Pulse (!) 103   Temp 98.6 ?F (37 ?C) (Axillary)   Resp 17   Ht 5\' 4"  (1.626 m)   Wt 108 kg   LMP 03/15/2021   BMI 40.85 kg/m?  ?No intake/output data recorded. ? ?Pt on hands/knees against birthing ball in bed w/ nitrous mask to face, crying  ?FHR baseline 125 bpm, Variability: moderate, Accelerations:present, Decelerations:  Present  mild variables ?Toco: q 1-34mins  ? ?SVE:  declined by pt ? ?Labs: ?Lab Results  ?Component Value Date  ? WBC 14.0 (H) 11/26/2021  ? HGB 11.0 (L) 11/26/2021  ? HCT 33.2 (L) 11/26/2021  ? MCV 86.0 11/26/2021  ? PLT 310 11/26/2021  ? ? ?Assessment / Plan: ?IOL d/t PPROM (5/12 @ 1400), s/p cytotec x 2, very uncomfortabl w/ contractions, declines SVE, requesting fentanyl and nitrous together.  RN discussed w/ anesthesiologist who states she is not comfortable w/ pt getting both together. Asked pt her thoughts about an epidural- screamed she does not want one. Pt demanding to speak to doctor. RN called anesthesiologist to come talk to pt. Pt ordered me and RN to get out of room.  ? ?Labor:  unknown ?Fetal Wellbeing:  Category II ?Pain Control:  currently nitrous oxide, received last dose of IV fentanyl @ 2248 ?Pre-eclampsia: N/A ?I/D:   PCN for GBS unknown/PPROM ?Anticipated MOD: NSVB ? ?05-02-2000 CNM, WHNP-BC ?11/28/2021, 12:26 AM  ?

## 2021-11-28 NOTE — Lactation Note (Signed)
This note was copied from a baby's chart. ?Lactation Consultation Note ? ?Patient Name: Nichole Hicks ?Today's Date: 11/28/2021 ?Reason for consult: Initial assessment;Late-preterm 34-36.6wks ?Age:28 hours ? 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 ?She desires to bottle feed formula and some breast milk.   ?She plans to call out for assistance with latching.  Baby was recently bottle fed formula. ? ?Mom does not have a pump but has a haaka. ? ?DEBP set up for LPTI.  21 flanges used for a better fit. ?Pos. Breast changes with pregnancy.   ? ?LC explained parts, settings, and washing of parts. ?Encouraged pumping every 3 hours. ? ?Mom was given resource sheet for lactation services.   ? ? ? ?Maternal Data ?Has patient been taught Hand Expression?: Yes ?Does the patient have breastfeeding experience prior to this delivery?: No ? ?Feeding ?Mother's Current Feeding Choice: Breast Milk and Formula ? ?LATCH Score ?  ? ?  ? ?  ? ?  ? ?  ? ?  ? ? ?Lactation Tools Discussed/Used ?Tools: Pump ?Breast pump type: Double-Electric Breast Pump ?Reason for Pumping: stimlulate milk supply LPTI ? ?Interventions ?  ? ?Discharge ?Pump: DEBP ? ?Consult Status ?Consult Status: Follow-up ?Date: 11/29/21 ?Follow-up type: In-patient ? ? ? ?Maryruth Hancock Braven Wolk ?11/28/2021, 7:32 PM ? ? ? ?

## 2021-11-28 NOTE — Progress Notes (Signed)
Patient ID: Nichole Hicks, female   DOB: 1996/06/21, 26 y.o.   MRN: [id number] [name] is a 26 y.o. G1P0000 at [redacted]w[redacted]d admitted for  IOL d/t PPROM ? ?Called by RN to review strip ? ?Subjective: ?comfortable with epidural and declines pushing ? ?Objective: ?BP 136/70   Pulse (!) 101   Temp 97.6 ?F (36.4 ?C) (Oral)   Resp 18   Ht 5\' 4"  (1.626 m)   Wt 108 kg   LMP 03/15/2021   SpO2 99%   BMI 40.85 kg/m?  ?No intake/output data recorded. ? ?FHR baseline 160 bpm, Variability: moderate, Accelerations:10x10, Decelerations:  Present  variable, occ late ?Toco: q 2-72mins  ? ?SVE:   Dilation: 10 ?Effacement (%): 80 ?Station: 0, Plus 1 ?Exam by:: [email or web address] RN ? ?Labs: ?Lab Results  ?Component Value Date  ? WBC 14.0 (H) 11/26/2021  ? HGB 11.0 (L) 11/26/2021  ? HCT 33.2 (L) 11/26/2021  ? MCV 86.0 11/26/2021  ? PLT 310 11/26/2021  ? ? ?Assessment / Plan: ?IOL d/t PPROM (5/12 @ 1400), now complete, doesn't want to push yet, good variability but some decels-RN to reposition pt.  ? ?Labor: active ?Fetal Wellbeing:  Category II ?Pain Control:  epidural ?Pre-eclampsia: N/A ?I/D:   PCN for GBS unknown/PPROM ?Anticipated MOD: NSVB ? ?05-02-2000 CNM, WHNP-BC ?11/28/2021, 5:27 AM  ?

## 2021-11-28 NOTE — Lactation Note (Addendum)
This note was copied from a baby's chart. ?Lactation Consultation Note ? ?Patient Name: Nichole Hicks ?Today's Date: 11/28/2021 ?Reason for consult: L&D Initial assessment;Difficult latch;Primapara;1st time breastfeeding;Late-preterm 34-36.6wks;Breastfeeding assistance;Other (Comment) (LC L/D visit at 50 mins PP. Baby STS w/ dad . LC offered to assist to latch. mom receptive. Baby opens wide, latched for a few strong sucks and was unable to sustain the latch. Due to being a LPT, LC offered to hand express, and spoon fed baby 1.10ml .) ?Age:58 mins ?Mom is aware she will be seen later on [facility] by [facility] .  ?Maternal Data ?Does the patient have breastfeeding experience prior to this delivery?: No ? ?Feeding ?Mother's Current Feeding Choice: Breast Milk and Formula ? ?LATCH Score ?Latch: Repeated attempts needed to sustain latch, nipple held in mouth throughout feeding, stimulation needed to elicit sucking reflex. ? ?Audible Swallowing: None ? ?Type of Nipple: Everted at rest and after stimulation (semi - compressible areolas due to edema) ? ?Comfort (Breast/Nipple): Soft / non-tender ? ?Hold (Positioning): Assistance needed to correctly position infant at breast and maintain latch. ? ?LATCH Score: 6 ? ? ?Lactation Tools Discussed/Used ?  ? ?Interventions ?Interventions: Breast feeding basics reviewed;Assisted with latch;Skin to skin;Hand express;Breast compression;Adjust position;Support pillows;Education ? ?Discharge ?  ? ?Consult Status ?Consult Status: Follow-up from L&D ?Date: 11/28/21 ?Follow-up type: In-patient ? ? ? ?Nichole Hicks ?11/28/2021, 12:32 PM ? ? ? ?

## 2021-11-28 NOTE — Discharge Summary (Signed)
Postpartum Discharge Summary      Patient Name: Nichole Hicks DOB: Dec 16, 1995 MRN: [id number]  Date of admission: 11/26/2021 Delivery date:11/28/2021  Delivering provider: [name] A  Date of discharge: 11/30/2021  Admitting diagnosis: Full-term premature rupture of membranes [O42.92] Intrauterine pregnancy: [redacted]w[redacted]d    Secondary diagnosis:  Principal Problem:   Preterm premature rupture of membranes  Additional problems:  None   Discharge diagnosis: Preterm Pregnancy Delivered                                              Post partum procedures: none Augmentation: Pitocin and Cytotec Complications: None  Hospital course: Onset of Labor With Vaginal Delivery      26y.o. yo G1P0101 at 335w6das admitted in Latent Labor with PPROM on 11/26/2021. Patient had an uncomplicated labor course as follows:  Membrane Rupture Time/Date: 2:00 PM ,11/26/2021   Delivery Method:Vaginal, Spontaneous  Episiotomy: None  Lacerations:  Periurethral  Patient had an uncomplicated postpartum course.  She is ambulating, tolerating a regular diet, passing flatus, and urinating well. Patient is discharged home in stable condition on 11/30/21.  Newborn Data: Birth date:11/28/2021  Birth time:11:21 AM  Gender:Female  Living status:Living  Apgars:6 ,8  Weight:2850 g   Magnesium Sulfate received: No BMZ received: No Rhophylac:No MMR:No T-DaP: declined Flu: No Transfusion:No  Physical exam  Vitals:   11/29/21 0534 11/29/21 1723 11/29/21 2244 11/30/21 0522  BP: 121/69 120/63 126/76 117/79  Pulse: (!) 106 89 98 92  Resp: _0 Temp: 97.7 F (36.5 C) 97.6 F (36.4 C) 98.1 F (36.7 C) 98.2 F (36.8 C)  TempSrc: Oral Oral Oral Oral  SpO2:  98% 99% 100%  Weight:      Height:       General: alert, cooperative, and no distress Lochia: appropriate Uterine Fundus: firm Incision: N/A DVT Evaluation: No evidence of DVT seen on physical exam. Negative Homan's sign. No cords or  calf tenderness. Labs: Lab Results  Component Value Date   WBC 14.0 (H) 11/26/2021   HGB 11.0 (L) 11/26/2021   HCT 33.2 (L) 11/26/2021   MCV 86.0 11/26/2021   PLT 310 11/26/2021      Latest Ref Rng & Units 12/08/2020    4:55 PM  CMP  Glucose 70 - 99 mg/dL 112    BUN 6 - 20 mg/dL 9    Creatinine 0.44 - 1.00 mg/dL 0.76    Sodium 135 - 145 mmol/L 132    Potassium 3.5 - 5.1 mmol/L 4.0    Chloride 98 - 111 mmol/L 101    CO2 22 - 32 mmol/L 21    Calcium 8.9 - 10.3 mg/dL 9.1    Total Protein 6.5 - 8.1 g/dL 7.6    Total Bilirubin 0.3 - 1.2 mg/dL 0.2    Alkaline Phos 38 - 126 U/L 81    AST 15 - 41 U/L 47    ALT 0 - 44 U/L 36     Edinburgh Score:    11/29/2021    3:20 PM  Edinburgh Postnatal Depression Scale Screening Tool  I have been able to laugh and see the funny side of things. 0  I have looked forward with enjoyment to things. 0  I have blamed myself unnecessarily when things went wrong. 1  I have been anxious or worried  for no good reason. 1  I have felt scared or panicky for no good reason. 0  Things have been getting on top of me. 1  I have been so unhappy that I have had difficulty sleeping. 0  I have felt sad or miserable. 1  I have been so unhappy that I have been crying. 1  The thought of harming myself has occurred to me. 0  Edinburgh Postnatal Depression Scale Total 5     After visit meds:  Allergies as of 11/30/2021       Reactions   Aspirin Hives   Ibuprofen Hives        Medication List     STOP taking these medications    cetirizine 10 MG chewable tablet Commonly known as: ZYRTEC   loratadine 10 MG tablet Commonly known as: CLARITIN   ondansetron 4 MG disintegrating tablet Commonly known as: ZOFRAN-ODT   pantoprazole 40 MG tablet Commonly known as: Protonix       TAKE these medications    acetaminophen 500 MG tablet Commonly known as: TYLENOL Take 2 tablets (1,000 mg total) by mouth every 6 (six) hours as needed.   albuterol 108  (90 Base) MCG/ACT inhaler Commonly known as: VENTOLIN HFA Inhale 2 puffs into the lungs every 6 (six) hours as needed for wheezing. For shortness of breath   PRENATAL VITAMINS PO Take by mouth.   Slynd 4 MG Tabs Generic drug: Drospirenone Take 1 tablet by mouth daily. Start on the Sunday after the baby turns 56 weeks old         Discharge home in stable condition Infant Feeding: Breast Infant Disposition:home with mother Discharge instruction: per After Visit Summary and Postpartum booklet. Activity: Advance as tolerated. Pelvic rest for 6 weeks.  Diet: routine diet Future Appointments: Future Appointments  Date Time Provider [place]  01/01/2022  1:10 PM Renee Harder, CNM [facility]   Follow up Visit:  Message sent to [place] on 11/28/21:  Please schedule this patient for a In person postpartum visit in 6 weeks with the following provider: Any provider.  Additional Postpartum F/U: none   Low risk pregnancy complicated by:  PPROM at 36 weeks Delivery mode:  Vaginal, Spontaneous  Anticipated Birth Control:  POPs   11/30/2021 Christin Fudge, CNM

## 2021-11-28 NOTE — Anesthesia Procedure Notes (Addendum)
Epidural ?Patient location during procedure: OB ?Start time: 11/28/2021 3:11 AM ?End time: 11/28/2021 3:23 AM ? ?Staffing ?Anesthesiologist: Lannie Fields, DO ?Performed: anesthesiologist  ? ?Preanesthetic Checklist ?Completed: patient identified, IV checked, risks and benefits discussed, monitors and equipment checked, pre-op evaluation and timeout performed ? ?Epidural ?Patient position: sitting ?Prep: DuraPrep and site prepped and draped ?Patient monitoring: continuous pulse ox, blood pressure, heart rate and cardiac monitor ?Approach: midline ?Location: L3-L4 ?Injection technique: LOR air ? ?Needle:  ?Needle type: Tuohy  ?Needle gauge: 17 G ?Needle length: 9 cm ?Needle insertion depth: 7 cm ?Catheter type: closed end flexible ?Catheter size: 19 Gauge ?Catheter at skin depth: 12 cm ?Test dose: negative ? ?Assessment ?Sensory level: T8 ?Events: blood not aspirated, injection not painful, no injection resistance, no paresthesia and negative IV test ? ?Additional Notes ?Patient identified. Risks/Benefits/Options discussed with patient including but not limited to bleeding, infection, nerve damage, paralysis, failed block, incomplete pain control, headache, blood pressure changes, nausea, vomiting, reactions to medication both or allergic, itching and postpartum back pain. Confirmed with bedside nurse the patient's most recent platelet count. Confirmed with patient that they are not currently taking any anticoagulation, have any bleeding history or any family history of bleeding disorders. Patient expressed understanding and wished to proceed. All questions were answered. Sterile technique was used throughout the entire procedure. Please see nursing notes for vital signs. Test dose was given through epidural catheter and negative prior to continuing to dose epidural or start infusion. Warning signs of high block given to the patient including shortness of breath, tingling/numbness in hands, complete motor  block, or any concerning symptoms with instructions to call for help. Patient was given instructions on fall risk and not to get out of bed. All questions and concerns addressed with instructions to call with any issues or inadequate analgesia.  Reason for block:procedure for pain ? ? ? ?

## 2021-11-28 NOTE — Progress Notes (Signed)
Patient ID: Nichole Hicks, female   DOB: 04/23/1996, 26 y.o.   MRN: [id number] ?Nichole Hicks is a 26 y.o. G1P0000 at [redacted]w[redacted]d admitted for induction of labor due to PPROM  ? ?Subjective: ?Requesting epidural ? ?Objective: ?BP 130/78   Pulse (!) 130   Temp 98 ?F (36.7 ?C) (Axillary)   Resp 18   Ht 5\' 4"  (1.626 m)   Wt 108 kg   LMP 03/15/2021   BMI 40.85 kg/m?  ?No intake/output data recorded. ? ?FHR baseline 130 bpm, Variability: moderate, Accelerations:present, Decelerations:  Absent ?Toco: not tracing well, ~q 05/15/2021  ? ?SVE:   Dilation: 8 ?Effacement (%): 80 ?Station: -2 ?Exam by:: [email or web address], RN ? ?Labs: ?Lab Results  ?Component Value Date  ? WBC 14.0 (H) 11/26/2021  ? HGB 11.0 (L) 11/26/2021  ? HCT 33.2 (L) 11/26/2021  ? MCV 86.0 11/26/2021  ? PLT 310 11/26/2021  ? ? ?Assessment / Plan: ?IOL d/t PPROM (5/12 @ 1400), s/p cytotec x 2 (last dose 1540), laboring and progressing on her own, requesting epdural ? ?Labor: active ?Fetal Wellbeing:  Category I ?Pain Control:  requests epidural ?Pre-eclampsia: N/A ?I/D:   PCN for GBS unknown/PPROM ?Anticipated MOD: NSVB ? ?05-02-2000 CNM, WHNP-BC ?11/28/2021, 3:12 AM  ?

## 2021-11-28 NOTE — Progress Notes (Signed)
Patient requested to see physician.  ? ?Patient asking for nitrous and fentanyl. I reviewed with her that I support whatever the policy is and we will not stray from that policy. Nursing has policy printed and has reviewed it.  Anesthesia would also not advise nitrous and fentanyl together.  ? ?Patient states she doesn't feel like either is working that well alone. Reviewed with the patient that it is possible she is in transition and that would then make sense. Offered cervical exam to the patient to determine if she is in transition. She would like to proceed with CE.  ? ?She requested CE by nurse Valarie Merino who did perform CE - pt is 8/80/-2.  ? ? ?Milas Hock, MD ?Attending Obstetrician & Gynecologist, Faculty Practice ?Center for [employer], [facility] ? ? ?

## 2021-11-29 ENCOUNTER — Ambulatory Visit: Payer: Medicaid Other

## 2021-11-29 ENCOUNTER — Ambulatory Visit: Payer: Self-pay

## 2021-11-29 MED ORDER — ACETAMINOPHEN 500 MG PO TABS
1000.0000 mg | ORAL_TABLET | Freq: Four times a day (QID) | ORAL | Status: DC
  Administered 2021-11-29 – 2021-11-30 (×3): 1000 mg via ORAL
  Filled 2021-11-29 (×3): qty 2

## 2021-11-29 NOTE — Progress Notes (Signed)
Post Partum Day #1 Subjective: no complaints, up ad lib, and tolerating PO; breastfeeding going okay; concerned re having a BM; POPs for contraception; interested in a circumcision for her son- she was consented and a note was placed in his chart  Objective: Blood pressure 121/69, pulse (!) 106, temperature 97.7 F (36.5 C), temperature source Oral, resp. rate 16, height 5\' 4"  (1.626 m), weight 108 kg, last menstrual period 03/15/2021, SpO2 99 %, unknown if currently breastfeeding.  Physical Exam:  General: alert, cooperative, and no distress Lochia: appropriate Uterine Fundus: firm DVT Evaluation: No evidence of DVT seen on physical exam.  Recent Labs    11/26/21 2339  HGB 11.0*  HCT 33.2*    Assessment/Plan: Plan for discharge tomorrow Recommended daily stool softener w prn Miralax, increase fluid and fiber until is in a good pattern   LOS: 3 days   2340 CNM 11/29/2021, 11:21 AM

## 2021-11-29 NOTE — Anesthesia Postprocedure Evaluation (Signed)
Anesthesia Post Note  Patient: [name]  Procedure(s) Performed: AN AD HOC LABOR EPIDURAL     Patient location during evaluation: Mother Baby Anesthesia Type: Epidural Level of consciousness: awake, awake and alert and oriented Pain management: pain level controlled Vital Signs Assessment: post-procedure vital signs reviewed and stable Respiratory status: spontaneous breathing and respiratory function stable Cardiovascular status: blood pressure returned to baseline Postop Assessment: no headache, epidural receding, patient able to bend at knees, adequate PO intake, no backache, no apparent nausea or vomiting and able to ambulate Anesthetic complications: no   No notable events documented.  Last Vitals:  Vitals:   11/29/21 0239 11/29/21 0534  BP: 123/76 121/69  Pulse: 82 (!) 106  Resp: 18 16  Temp: 36.6 C 36.5 C  SpO2:      Last Pain:  Vitals:   11/29/21 0534  TempSrc: Oral  PainSc: 4    Pain Goal: Patients Stated Pain Goal: 0 (11/28/21 0305)                 Cleda Clarks

## 2021-11-29 NOTE — Lactation Note (Signed)
This note was copied from a baby's chart. Lactation Consultation Note Mom stated she is having trouble latching. Mom stated she wore the shells today before she showered and she notices improvement in her breast. She thinks they helped. Mom stated they said her breast had swelling in them. LC was able to compress areola and w/much stimulation get the nipple to evert slightly. Baby wasn't able to maintain nipple in his mouth. Baby has recessed chin, suggested mom do chin tug. Baby is biting and clamping hard. LC fitted mom w/#20 NS. Baby latched right away. Encouraged mom to firm breast when latching w/"C" hold. Baby very sleepy. Suckled a few times, needing much stimulation. LC inserted formula w/curve tip syring to keep baby stimulated to suck. Baby looks jaundice. Informed RN.  Plan: Mom is to wear shells during the day. Pump q3hr Hand express colostrum and spoon feed to wake baby for stimulation if needed. Wear NS for feeding in football hold. Then supplement after BF. Give colostrum first then formula.  Patient Name: Nichole Hicks [id number] Date: 11/29/2021 Reason for consult: Follow-up assessment;Mother's request;Primapara;Late-preterm 34-36.6wks Age:31 hours  Maternal Data Has patient been taught Hand Expression?: Yes Does the patient have breastfeeding experience prior to this delivery?: No  Feeding Mother's Current Feeding Choice: Breast Milk and Formula  LATCH Score Latch: Repeated attempts needed to sustain latch, nipple held in mouth throughout feeding, stimulation needed to elicit sucking reflex.  Audible Swallowing: None  Type of Nipple: Flat  Comfort (Breast/Nipple): Filling, red/small blisters or bruises, mild/mod discomfort (edema/noted better mom stated)  Hold (Positioning): Full assist, staff holds infant at breast  LATCH Score: 3   Lactation Tools Discussed/Used Tools: Shells;Pump;Flanges;Nipple Shields Nipple shield size: 20 Flange Size: 21 Breast  pump type: Double-Electric Breast Pump Reason for Pumping: LPI Pumping frequency: q3h  Interventions Interventions: Breast feeding basics reviewed;Assisted with latch;Skin to skin;Breast massage;Hand express;Pre-pump if needed;Breast compression;Adjust position;Support pillows;Position options;Expressed milk;Shells;DEBP;Education  Discharge    Consult Status Consult Status: Follow-up Date: 11/30/21 Follow-up type: In-patient    Charyl Dancer 11/29/2021, 11:53 PM
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
## 2021-11-30 LAB — SURGICAL PATHOLOGY

## 2021-11-30 MED ORDER — SLYND 4 MG PO TABS: 1.0000 | ORAL_TABLET | Freq: Every day | ORAL | 3 refills | Status: AC

## 2021-11-30 MED ORDER — ACETAMINOPHEN 500 MG PO TABS
1000.0000 mg | ORAL_TABLET | Freq: Four times a day (QID) | ORAL | 0 refills | Status: AC | PRN
Start: 2021-11-30 — End: ?

## 2021-11-30 NOTE — Lactation Note (Signed)
This note was copied from a baby's chart. Lactation Consultation Note  Patient Name: Nichole Hicks [id number] Date: 11/30/2021 Reason for consult: Follow-up assessment;1st time breastfeeding;Primapara Age:28 hours   P1 mother whose infant is now 85 hours old.  This is a later preterm baby at 36+6 weeks with a CGA of 37+1 weeks.  Mother's current feeding preference is breast/formula.  Reviewed feeding plan for discharge.  Baby has been primarily formula feeding at this time.  Reminded mother to latch prior to supplementing.  Discussed volumes of 30+ mls now and after discharge.  Mother will continue to pump every three hours and feed back any expressed milk she obtains to baby.    Mother has a [facility] appointment scheduled, but does not have an electric pump at this time.  Suggested a Stork pump and mother appreciiatve.  MD signed form and RN called/faxed information.  Informed family that the pump should arrive within 2 hours from now and to alert RN if they do not receive their pump in this time frame.  Family has our OP phone number for any further concerns after discharge.    Grandmother will assist with newborn care at home.  Father present and supportive.   Maternal Data    Feeding Mother's Current Feeding Choice: Breast Milk and Formula  LATCH Score                    Lactation Tools Discussed/Used    Interventions Interventions: Education;Breast feeding basics reviewed  Discharge Discharge Education: Engorgement and breast care Pump: DEBP;Manual;Stork Pump (RN to enter information for a Stork pump) WIC Program: No (Mother has a [facility] appt)  Consult Status Consult Status: Complete Date: 11/30/21 Follow-up type: Call as needed    Nichole Hicks 11/30/2021, 12:54 PM

## 2021-11-30 NOTE — Progress Notes (Signed)
Stork pump delivered to pt.

## 2021-12-06 ENCOUNTER — Telehealth (HOSPITAL_COMMUNITY): Payer: Self-pay | Admitting: *Deleted

## 2021-12-06 NOTE — Telephone Encounter (Addendum)
Mom reports feeling good, except has had HA since Friday with little relief from tylenol. Reports BP's are normal. Suggested mom contact OB office to report HA. Mom agreed. No other concerns about herself at this time. EPDS=3 [facility] score=5) Mom reports baby is doing well. Feeding, peeing, and pooping without difficulty. Having some issues latching. Mom given phone # for outpatient [facility] appointment. Safe sleep reviewed. Mom reports no concerns about baby at present.  Odis Hollingshead, RN 12-06-2021 at 2:11pm

## 2022-01-01 ENCOUNTER — Ambulatory Visit: Payer: Medicaid Other

## 2022-02-20 ENCOUNTER — Encounter (INDEPENDENT_AMBULATORY_CARE_PROVIDER_SITE_OTHER): Payer: Self-pay

## 2022-04-29 IMAGING — US US MFM OB DETAIL+14 WK
1 series · 13 of 28 positions shown · non-contrast
Comparison: none

[Series 1: us mfm ob detail+14 wk · 54 acquisitions, 13 frames shown]
[im 2/54]
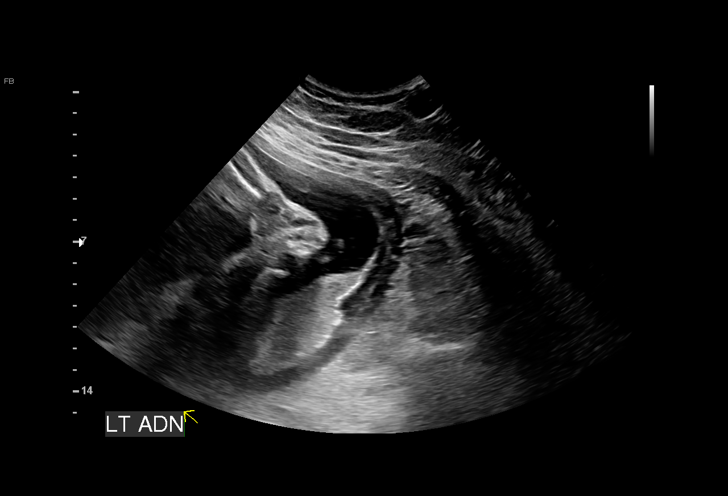
[im 6/54]
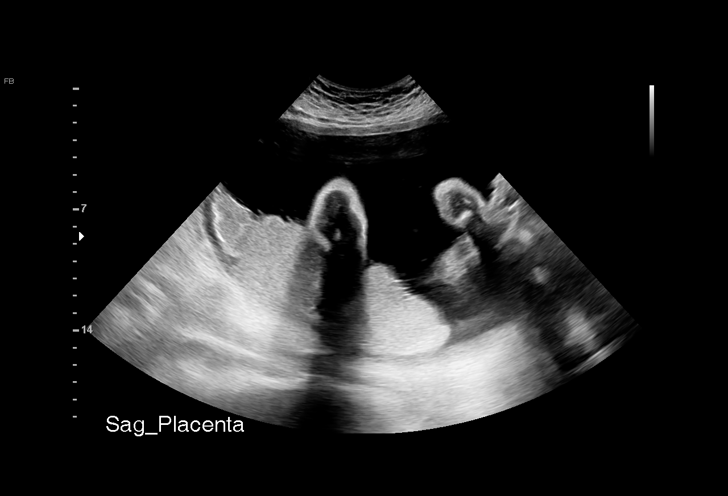
[im 10/54]
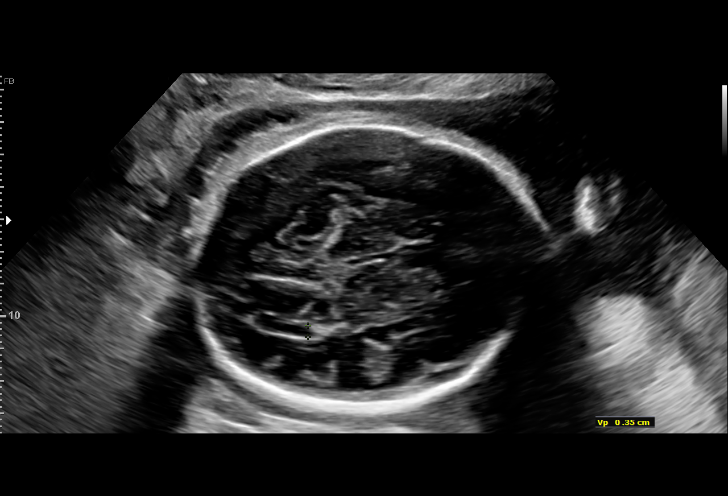
[im 14/54]
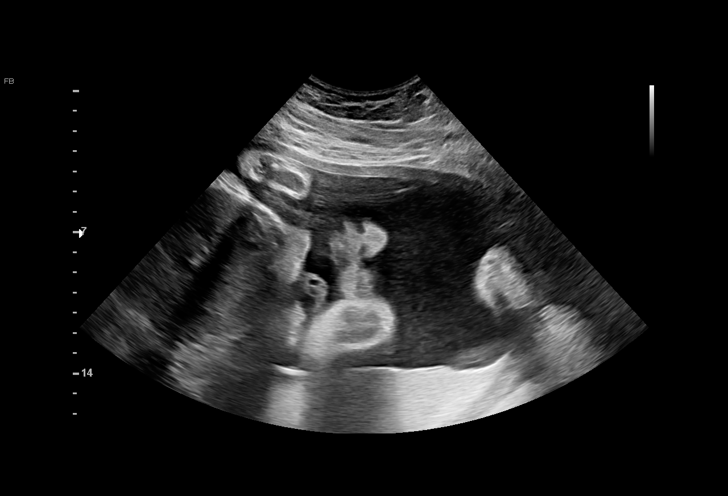
[im 18/54]
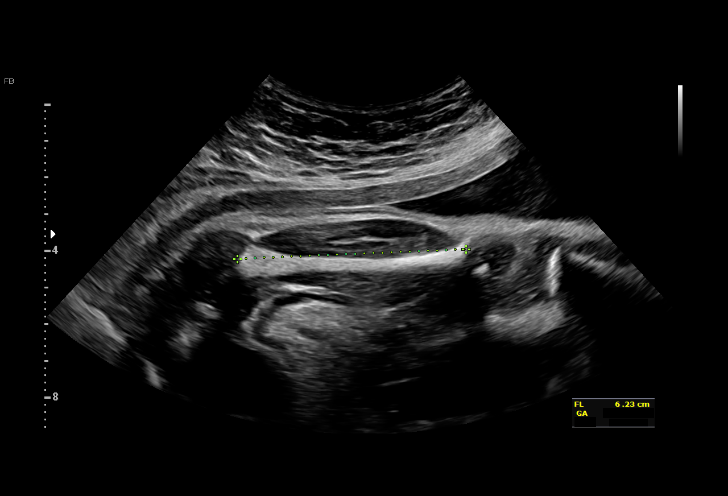
[im 22/54]
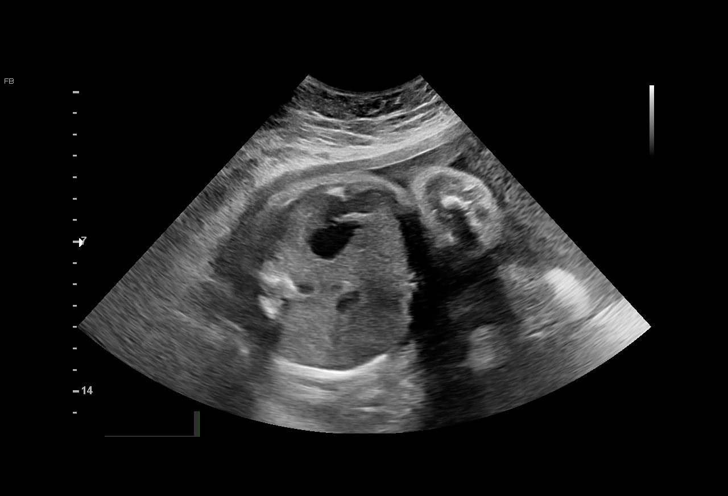
[im 28/54]
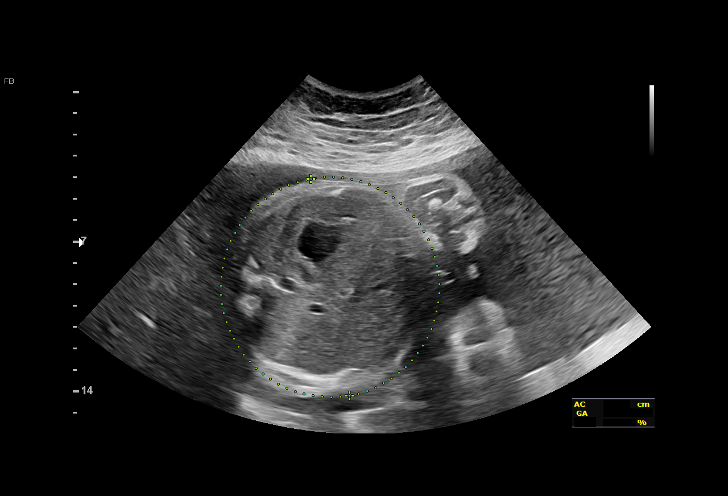
[im 32/54]
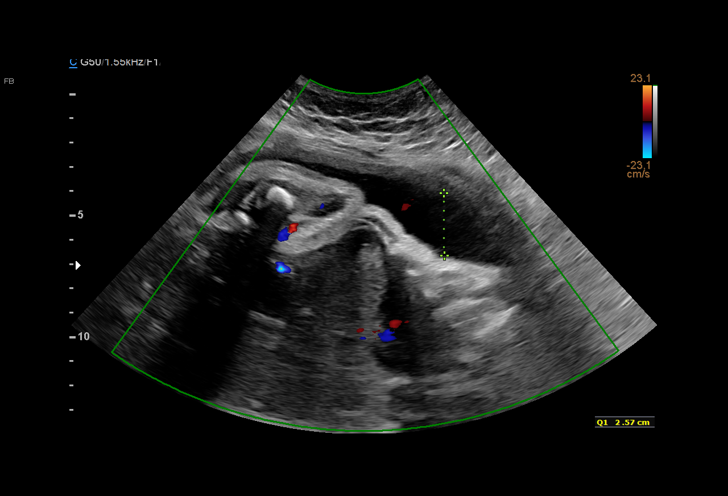
[im 36/54]
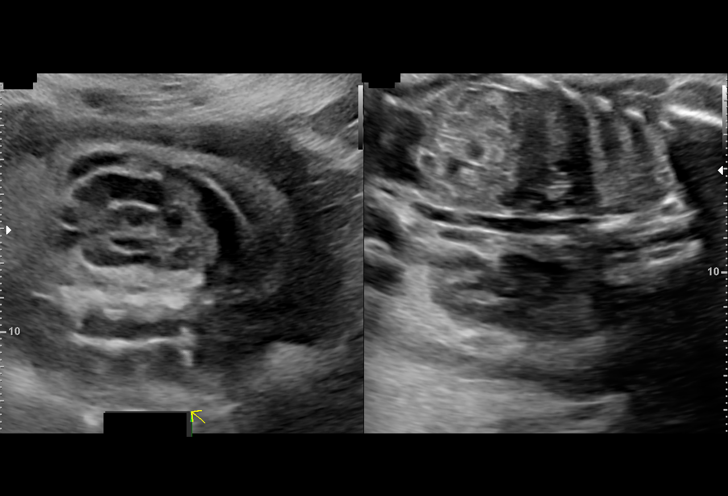
[im 40/54]
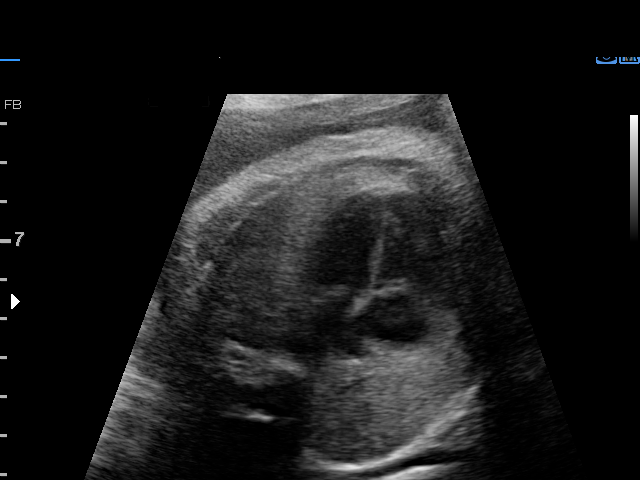
[im 44/54]
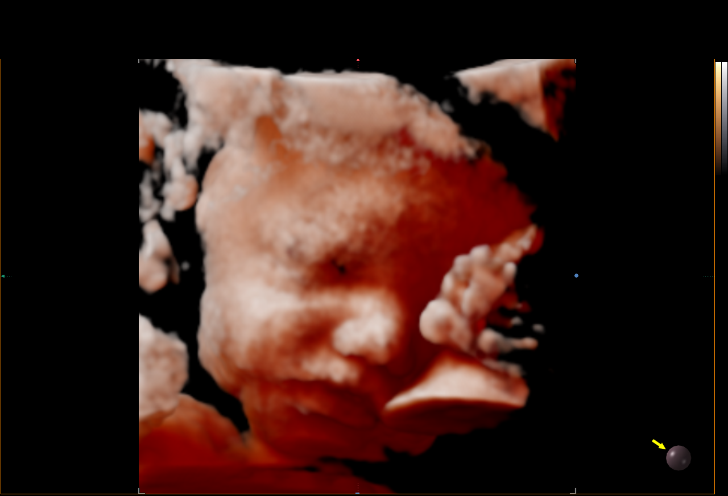
[im 48/54]
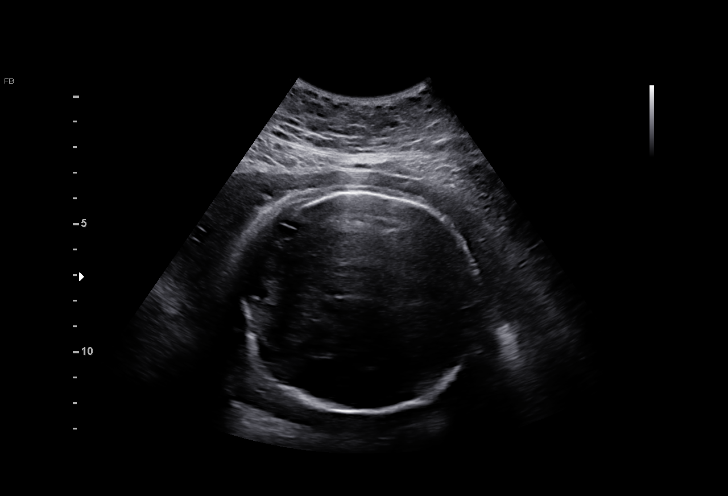
[im 52/54]
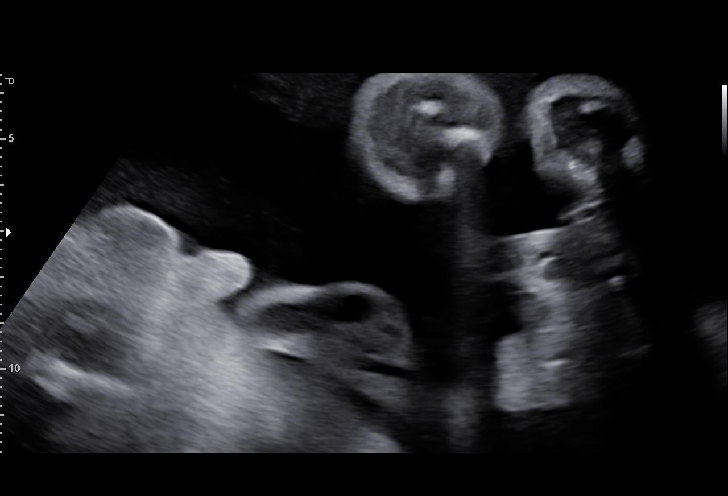

[13 of 28 positions shown; findings below may reference images not displayed]

Indications

 Obesity complicating pregnancy, third
 trimester (BMI 40)
 32 weeks gestation of pregnancy
 Encounter for antenatal screening for
 malformations
Fetal Evaluation

 Num Of Fetuses:         1
 Fetal Heart Rate(bpm):  127
 Cardiac Activity:       Observed
 Presentation:           Cephalic
 Placenta:               Posterior
 P. Cord Insertion:      Not well visualized

 Amniotic Fluid
 AFI FV:      Within normal limits

 AFI Sum(cm)     %Tile       Largest Pocket(cm)
 16.6            60

 RUQ(cm)       RLQ(cm)       LUQ(cm)        LLQ(cm)

Biometry

 BPD:      87.2  mm     G. Age:  35w 1d         96  %    CI:        74.85   %    70 - 86
                                                         FL/HC:      19.7   %    19.9 -
 HC:      319.8  mm     G. Age:  36w 0d         91  %    HC/AC:      0.99        0.96 -
 AC:       324   mm     G. Age:  36w 2d       > 99  %    FL/BPD:     72.1   %    71 - 87
 FL:       62.9  mm     G. Age:  32w 4d         33  %    FL/AC:      19.4   %    20 - 24
 HUM:      55.5  mm     G. Age:  32w 2d         48  %
 CER:      41.6  mm     G. Age:  33w 1d         39  %

 LV:        3.5  mm
 Est. FW:    0365  gm    5 lb 12 oz      97  %
OB History

 Gravidity:    1
Gestational Age

 LMP:           32w 5d        Date:  03/15/21                 EDD:   12/20/21
 U/S Today:     35w 0d                                        EDD:   12/04/21
 Best:          32w 5d     Det. By:  LMP  (03/15/21)          EDD:   12/20/21
Anatomy

 Cranium:               Appears normal         LVOT:                   Not well visualized
 Cavum:                 Appears normal         Aortic Arch:            Not well visualized
 Ventricles:            Appears normal         Ductal Arch:            Not well visualized
 Choroid Plexus:        Appears normal         Diaphragm:              Appears normal
 Cerebellum:            Appears normal         Stomach:                Appears normal, left
                                                                       sided
 Posterior Fossa:       Appears normal         Abdomen:                Appears normal
 Nuchal Fold:           Not applicable (>20    Abdominal Wall:         Not well visualized
                        wks GA)
 Face:                  Appears normal         Cord Vessels:           Appears normal (3
                        (orbits and profile)                           vessel cord)
 Lips:                  Appears normal         Kidneys:                Appear normal
 Palate:                Not well visualized    Bladder:                Appears normal
 Thoracic:              Appears normal         Spine:                  Not well visualized
 Heart:                 Appears normal         Upper Extremities:      Visualized
                        (4CH, axis, and
                        situs)
 RVOT:                  Not well visualized    Lower Extremities:      Visualized

 Other:  Fetus appears to be a male. Nasal bone visualized. Lenses
         visualized. Technicallly difficult due to advanced GA and maternal
         habitus.
Cervix Uterus Adnexa

 Adnexa
 No abnormality visualized.
Comments

 This patient was seen for a detailed fetal anatomy scan as
 she recently transferred her care from an OB in [HOSPITAL] for
 delivery at [HOSPITAL] as she would like to attempt a water
 birth.
 She denies any significant past medical history.  She has
 been experiencing hyperemesis throughout her pregnancy.
 She had a cell free DNA test earlier in her pregnancy which
 indicated a low risk for trisomy 21, 18, and 13. A male fetus is
 predicted.
 She was informed that the fetal growth measures large for
 her gestational [AGE]th percentile).  There was normal
 amniotic fluid.  She has screened negative for gestational
 diabetes.
 The views of the fetal anatomy were limited today due to her
 advanced gestational age.
 The patient was informed that anomalies may be missed due
 to technical limitations. If the fetus is in a suboptimal position
 or maternal habitus is increased, visualization of the fetus in
 the maternal uterus may be impaired.
 As her BMI is greater than 40, we will start weekly fetal
 testing at 34 weeks.
 She will have an NST scheduled in 1 week.
